# Patient Record
Sex: Female | Born: 1960 | Race: Black or African American | Hispanic: No | Marital: Married | Smoking: Never smoker
Health system: Southern US, Community
[De-identification: ages and names within clinical notes are randomized; demographics above are authoritative.]

## PROBLEM LIST (undated history)

## (undated) DIAGNOSIS — E7800 Pure hypercholesterolemia, unspecified: Secondary | ICD-10-CM

## (undated) DIAGNOSIS — I10 Essential (primary) hypertension: Secondary | ICD-10-CM

## (undated) DIAGNOSIS — E876 Hypokalemia: Secondary | ICD-10-CM

## (undated) DIAGNOSIS — Z309 Encounter for contraceptive management, unspecified: Secondary | ICD-10-CM

## (undated) DIAGNOSIS — Z780 Asymptomatic menopausal state: Secondary | ICD-10-CM

## (undated) DIAGNOSIS — N951 Menopausal and female climacteric states: Secondary | ICD-10-CM

## (undated) DIAGNOSIS — Z79890 Hormone replacement therapy (postmenopausal): Secondary | ICD-10-CM

## (undated) DIAGNOSIS — B009 Herpesviral infection, unspecified: Secondary | ICD-10-CM

## (undated) DIAGNOSIS — R87619 Unspecified abnormal cytological findings in specimens from cervix uteri: Secondary | ICD-10-CM

## (undated) HISTORY — DX: Unspecified abnormal cytological findings in specimens from cervix uteri: R87.619

## (undated) HISTORY — DX: Menopausal and female climacteric states: N95.1

## (undated) HISTORY — DX: Encounter for contraceptive management, unspecified: Z30.9

## (undated) HISTORY — DX: Hormone replacement therapy: Z79.890

## (undated) HISTORY — DX: Essential (primary) hypertension: I10

## (undated) HISTORY — DX: Pure hypercholesterolemia, unspecified: E78.00

## (undated) HISTORY — PX: SHOULDER ARTHROSCOPY: SHX128

## (undated) HISTORY — DX: Hypokalemia: E87.6

## (undated) HISTORY — DX: Asymptomatic menopausal state: Z78.0

## (undated) HISTORY — PX: CERVICAL CONE BIOPSY: SUR198

## (undated) HISTORY — DX: Herpesviral infection, unspecified: B00.9

---

## 2000-10-08 ENCOUNTER — Encounter: Payer: Self-pay | Admitting: Internal Medicine

## 2000-10-08 ENCOUNTER — Ambulatory Visit (HOSPITAL_COMMUNITY): Admission: RE | Admit: 2000-10-08 | Discharge: 2000-10-08 | Payer: Self-pay | Admitting: Internal Medicine

## 2000-11-22 ENCOUNTER — Ambulatory Visit (HOSPITAL_COMMUNITY): Admission: RE | Admit: 2000-11-22 | Discharge: 2000-11-22 | Payer: Self-pay | Admitting: Neurology

## 2001-01-31 ENCOUNTER — Ambulatory Visit (HOSPITAL_COMMUNITY): Admission: RE | Admit: 2001-01-31 | Discharge: 2001-01-31 | Payer: Self-pay | Admitting: Neurology

## 2001-01-31 ENCOUNTER — Encounter: Payer: Self-pay | Admitting: Neurology

## 2001-07-01 ENCOUNTER — Other Ambulatory Visit: Admission: RE | Admit: 2001-07-01 | Discharge: 2001-07-01 | Payer: Self-pay

## 2001-11-28 ENCOUNTER — Emergency Department (HOSPITAL_COMMUNITY): Admission: EM | Admit: 2001-11-28 | Discharge: 2001-11-28 | Payer: Self-pay | Admitting: Emergency Medicine

## 2002-05-05 ENCOUNTER — Emergency Department (HOSPITAL_COMMUNITY): Admission: EM | Admit: 2002-05-05 | Discharge: 2002-05-06 | Payer: Self-pay | Admitting: Emergency Medicine

## 2002-06-25 ENCOUNTER — Encounter: Payer: Self-pay | Admitting: Family Medicine

## 2002-06-25 ENCOUNTER — Ambulatory Visit (HOSPITAL_COMMUNITY): Admission: RE | Admit: 2002-06-25 | Discharge: 2002-06-25 | Payer: Self-pay | Admitting: Family Medicine

## 2002-07-21 ENCOUNTER — Other Ambulatory Visit: Admission: RE | Admit: 2002-07-21 | Discharge: 2002-07-21 | Payer: Self-pay

## 2002-12-26 ENCOUNTER — Emergency Department (HOSPITAL_COMMUNITY): Admission: EM | Admit: 2002-12-26 | Discharge: 2002-12-26 | Payer: Self-pay | Admitting: Emergency Medicine

## 2003-05-04 ENCOUNTER — Ambulatory Visit (HOSPITAL_COMMUNITY): Admission: RE | Admit: 2003-05-04 | Discharge: 2003-05-04 | Payer: Self-pay

## 2004-11-06 ENCOUNTER — Emergency Department (HOSPITAL_COMMUNITY): Admission: EM | Admit: 2004-11-06 | Discharge: 2004-11-06 | Payer: Self-pay | Admitting: Emergency Medicine

## 2004-11-11 ENCOUNTER — Emergency Department (HOSPITAL_COMMUNITY): Admission: EM | Admit: 2004-11-11 | Discharge: 2004-11-11 | Payer: Self-pay | Admitting: Emergency Medicine

## 2004-11-23 ENCOUNTER — Ambulatory Visit: Payer: Self-pay | Admitting: Orthopedic Surgery

## 2004-11-27 ENCOUNTER — Encounter (HOSPITAL_COMMUNITY): Admission: RE | Admit: 2004-11-27 | Discharge: 2004-12-27 | Payer: Self-pay | Admitting: Orthopedic Surgery

## 2004-12-28 ENCOUNTER — Encounter (HOSPITAL_COMMUNITY): Admission: RE | Admit: 2004-12-28 | Discharge: 2005-01-27 | Payer: Self-pay | Admitting: Orthopedic Surgery

## 2005-01-11 ENCOUNTER — Ambulatory Visit: Payer: Self-pay | Admitting: Orthopedic Surgery

## 2005-01-19 ENCOUNTER — Emergency Department (HOSPITAL_COMMUNITY): Admission: EM | Admit: 2005-01-19 | Discharge: 2005-01-20 | Payer: Self-pay | Admitting: *Deleted

## 2005-04-10 ENCOUNTER — Other Ambulatory Visit: Admission: RE | Admit: 2005-04-10 | Discharge: 2005-04-10 | Payer: Self-pay

## 2005-06-28 ENCOUNTER — Ambulatory Visit: Payer: Self-pay | Admitting: Orthopedic Surgery

## 2006-07-17 IMAGING — CR DG SHOULDER 2+V*R*
2 series · 2 of 2 positions shown · non-contrast
Comparison: none

CLINICAL DATA: Dislocation, pain. 
 RIGHT SHOULDER - 2 VIEW:
 There is an inferior dislocation of the shoulder.   Question Hill-Sachs deformity.   Otherwise normal fracture.

[view not recorded (1 of 2)]
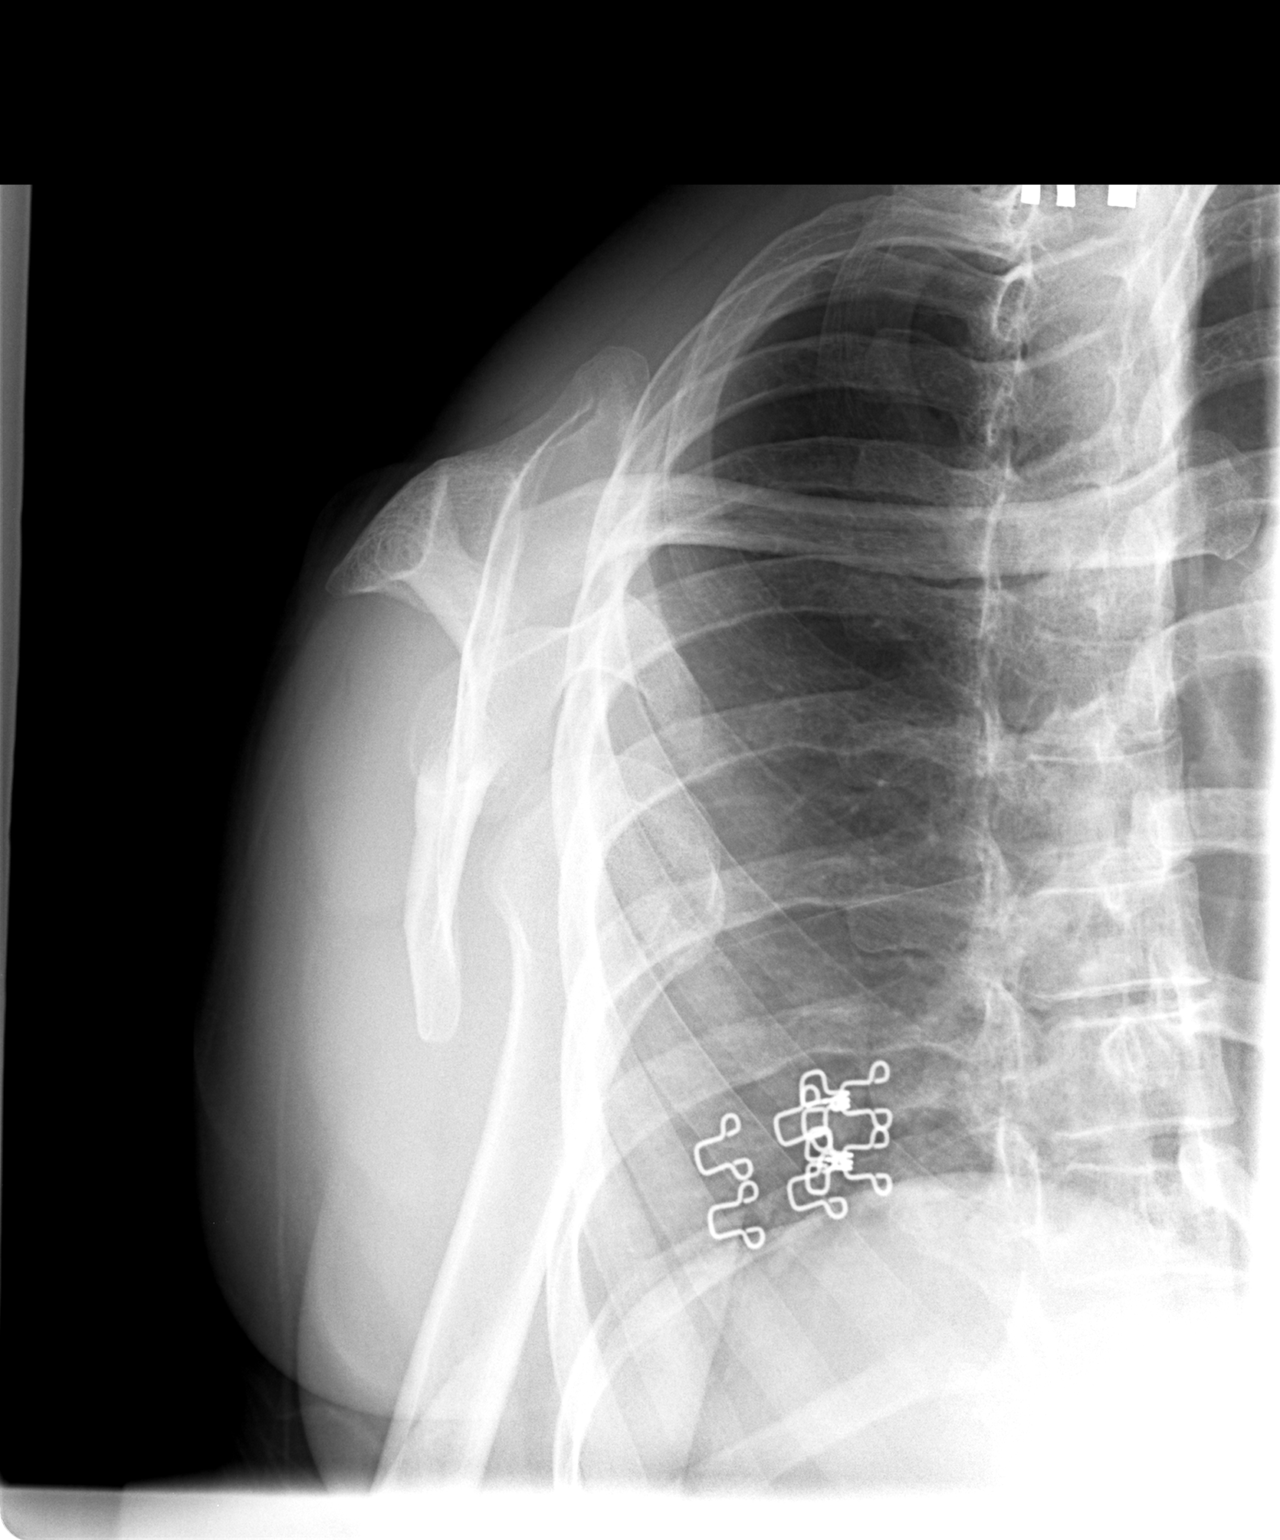

[view not recorded (2 of 2)]
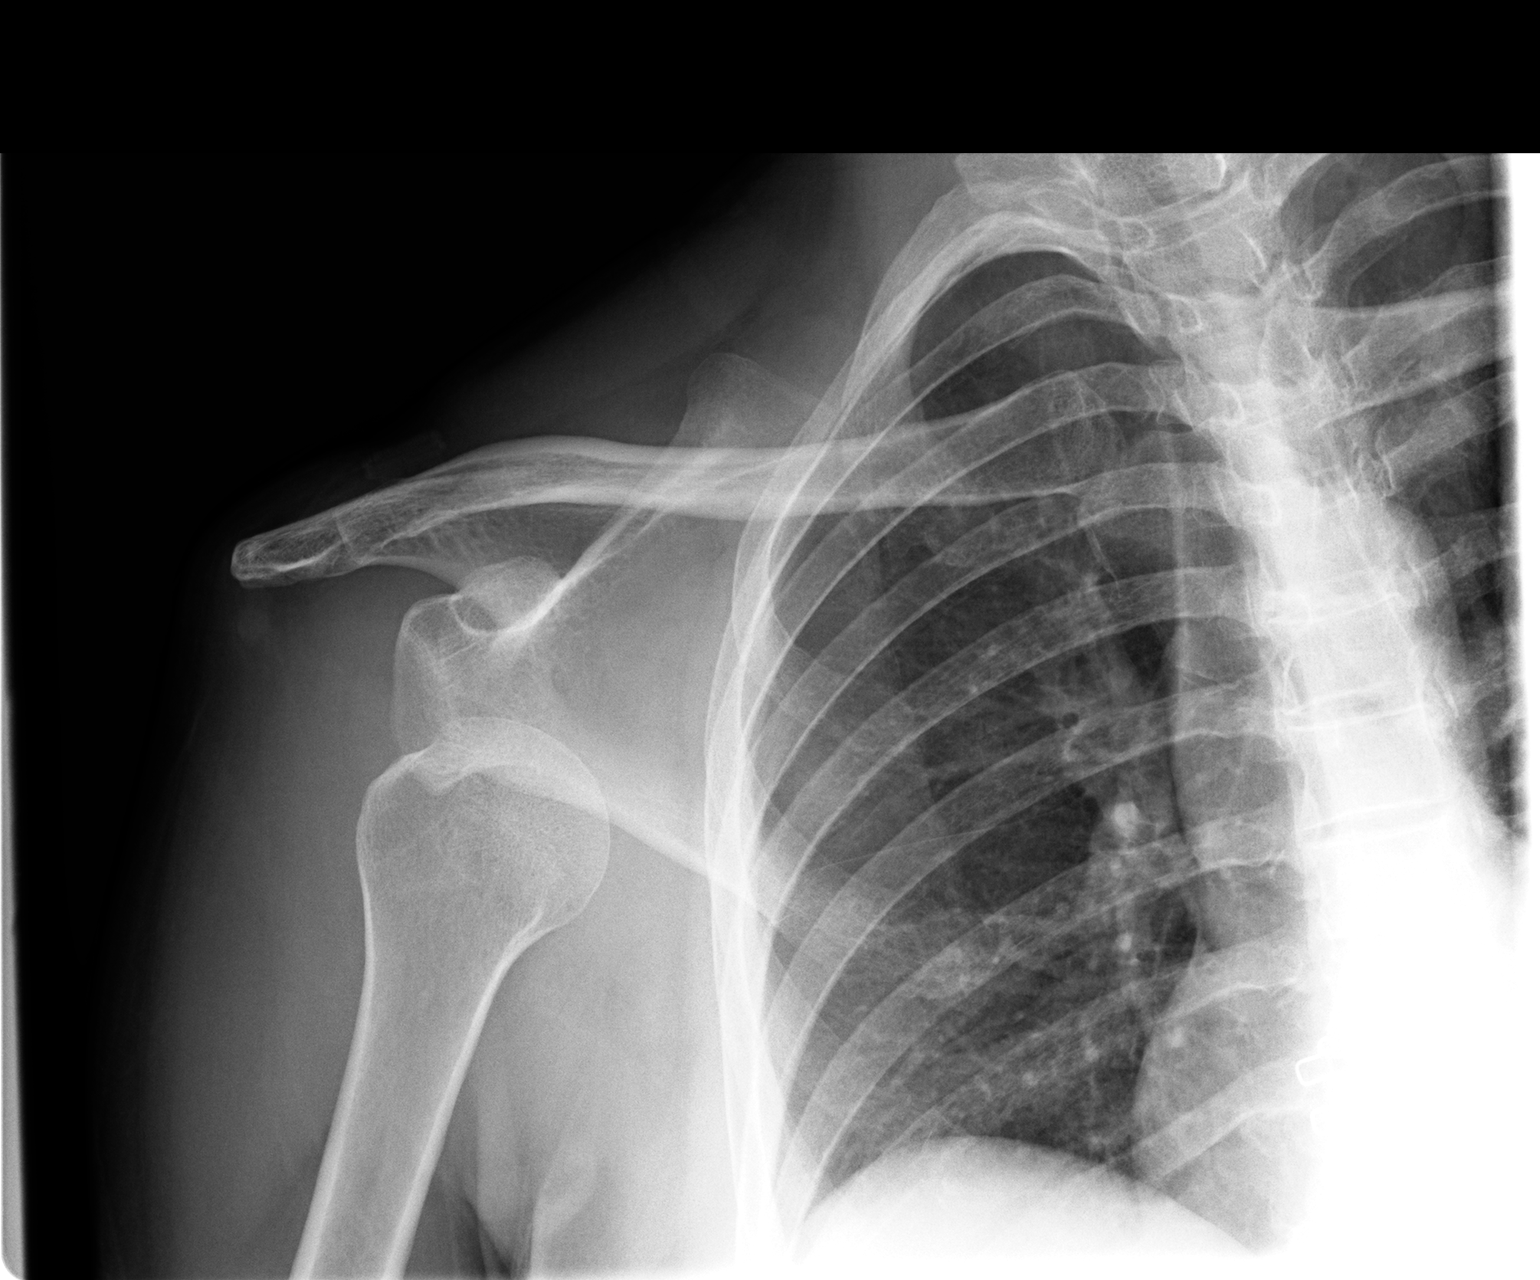

[2 of 2 positions shown; findings below may reference images not displayed]

IMPRESSION: Inferior dislocation of the shoulder.

## 2006-07-17 IMAGING — CR DG SHOULDER 2+V PORT*R*
1 series · 1 of 1 positions shown · non-contrast
Comparison: none

HISTORY: Right shoulder pain, post reduction

PORTABLE RIGHT SHOULDER ONE VIEW:
Portable exam 3310 hours.
Apparent reduction of glenohumeral dislocation on single AP view.
No orthogonal view for correlation.
No definite fracture visualized.
AC joint alignment normal.

[view not recorded]
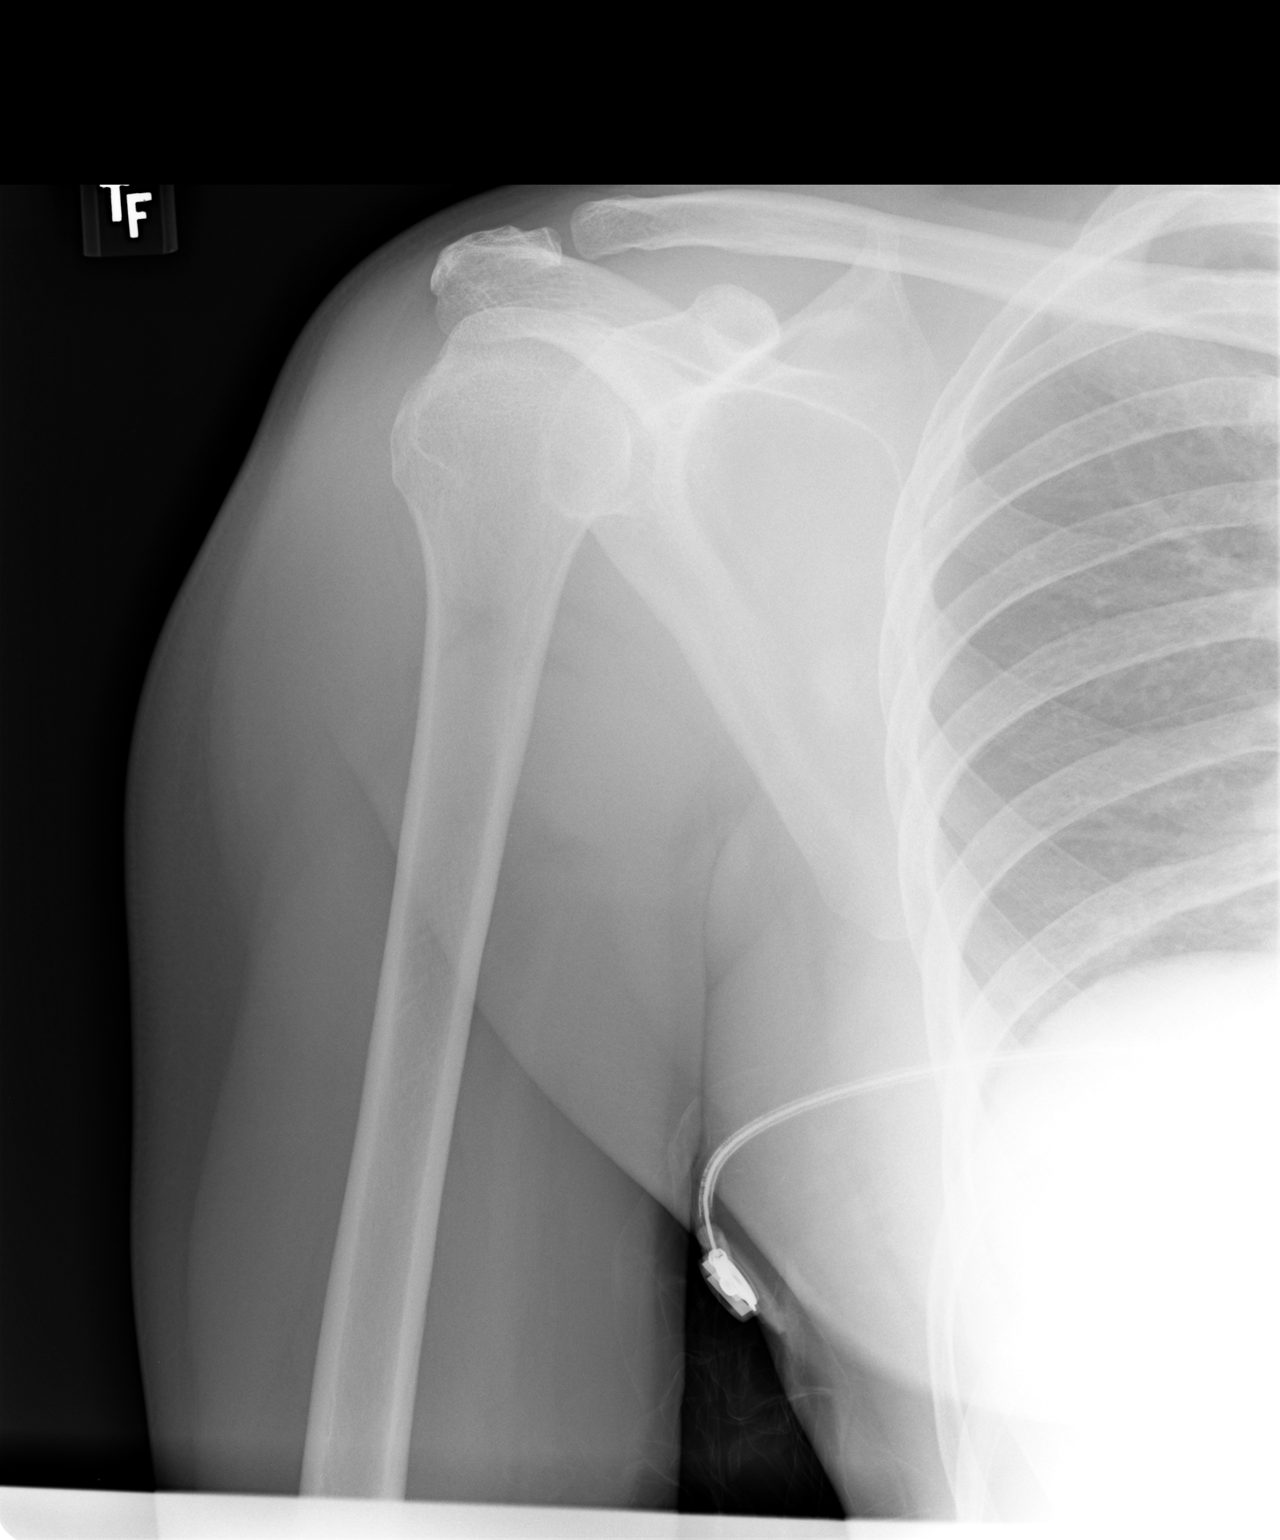

[1 of 1 positions shown; findings below may reference images not displayed]

IMPRESSION: Apparent reduction of glenohumeral dislocation on single AP view.

## 2007-06-13 ENCOUNTER — Ambulatory Visit (HOSPITAL_COMMUNITY): Admission: RE | Admit: 2007-06-13 | Discharge: 2007-06-13 | Payer: Self-pay | Admitting: Family Medicine

## 2010-04-19 ENCOUNTER — Other Ambulatory Visit
Admission: RE | Admit: 2010-04-19 | Discharge: 2010-04-19 | Payer: Self-pay | Source: Home / Self Care | Admitting: Obstetrics & Gynecology

## 2010-04-24 ENCOUNTER — Ambulatory Visit (HOSPITAL_COMMUNITY)
Admission: RE | Admit: 2010-04-24 | Discharge: 2010-04-24 | Payer: Self-pay | Source: Home / Self Care | Admitting: Obstetrics & Gynecology

## 2010-07-03 ENCOUNTER — Emergency Department (HOSPITAL_COMMUNITY)
Admission: EM | Admit: 2010-07-03 | Discharge: 2010-07-03 | Disposition: A | Discharge status: Home / Self Care | Payer: BC Managed Care – PPO | Attending: Emergency Medicine | Admitting: Emergency Medicine

## 2010-07-03 DIAGNOSIS — K6289 Other specified diseases of anus and rectum: Secondary | ICD-10-CM | POA: Insufficient documentation

## 2010-07-03 DIAGNOSIS — K5900 Constipation, unspecified: Secondary | ICD-10-CM | POA: Insufficient documentation

## 2010-07-03 DIAGNOSIS — K625 Hemorrhage of anus and rectum: Secondary | ICD-10-CM | POA: Insufficient documentation

## 2010-10-06 NOTE — Procedures (Signed)
[facility]  Patient:    Beth Vang, Beth Vang                    MRN: [id number] Proc. Date: 11/22/00 Attending:  Kelli Hope, M.D.                           Procedure Report  DATE OF BIRTH:  1961-03-14  PROCEDURE PERFORMED:  Diagnostic lumbar puncture.  OPERATOR:  Kelli Hope, M.D.  INDICATIONS FOR PROCEDURE:  Rule out multiple sclerosis.  DESCRIPTION OF PROCEDURE:  Informed consent form was signed and placed in the chart after indications, risks and benefits of the procedure were discussed with the patient and she agreed to proceed.  The patient was placed in the right lateral decubitus position and prepped and draped in the usual sterile fashion.  Local anesthesia was achieved with 5 cc of lidocaine.  A 20 gauge spinal needle was inserted into the L3-4 interspace and advanced until clear CSF was obtained.  Opening pressure was 210 mmH2O.  A total of 8 cc of spinal fluid was drained and sent off for the following studies:  tube #1 2 cc cell count and differential, tube #2 2 cc glucose, protein, oligoclonal banding, immunoelectrophoresis, tube #3 2 cc Gram stain and culture, tube #4 2 cc hold. Closing pressure was 180 mm water.  Needle was withdrawn and hemostasis was obtained.  The patient tolerated the procedure well.  She was advised to lie flat for one hour after which she may be discharged home.  She was to call if any complications such as severe headache, fever or bleeding occur. DD:  11/22/00 TD:  11/22/00 Job: [id number]

## 2011-05-08 ENCOUNTER — Other Ambulatory Visit: Payer: Self-pay | Admitting: Obstetrics & Gynecology

## 2011-05-10 ENCOUNTER — Other Ambulatory Visit: Payer: Self-pay | Admitting: Obstetrics & Gynecology

## 2011-05-10 ENCOUNTER — Other Ambulatory Visit (HOSPITAL_COMMUNITY)
Admission: RE | Admit: 2011-05-10 | Discharge: 2011-05-10 | Disposition: A | Discharge status: Home / Self Care | Payer: BC Managed Care – PPO | Source: Ambulatory Visit | Attending: Obstetrics & Gynecology | Admitting: Obstetrics & Gynecology

## 2011-05-10 DIAGNOSIS — Z01419 Encounter for gynecological examination (general) (routine) without abnormal findings: Secondary | ICD-10-CM | POA: Insufficient documentation

## 2011-05-14 ENCOUNTER — Other Ambulatory Visit: Payer: Self-pay | Admitting: Obstetrics & Gynecology

## 2012-05-28 ENCOUNTER — Other Ambulatory Visit: Payer: Self-pay | Admitting: Adult Health

## 2012-05-28 ENCOUNTER — Other Ambulatory Visit (HOSPITAL_COMMUNITY)
Admission: RE | Admit: 2012-05-28 | Discharge: 2012-05-28 | Disposition: A | Discharge status: Home / Self Care | Payer: BC Managed Care – PPO | Source: Ambulatory Visit | Attending: Obstetrics and Gynecology | Admitting: Obstetrics and Gynecology

## 2012-05-28 DIAGNOSIS — Z1151 Encounter for screening for human papillomavirus (HPV): Secondary | ICD-10-CM | POA: Insufficient documentation

## 2012-05-28 DIAGNOSIS — Z01419 Encounter for gynecological examination (general) (routine) without abnormal findings: Secondary | ICD-10-CM | POA: Insufficient documentation

## 2012-05-28 DIAGNOSIS — Z113 Encounter for screening for infections with a predominantly sexual mode of transmission: Secondary | ICD-10-CM | POA: Insufficient documentation

## 2013-05-07 ENCOUNTER — Other Ambulatory Visit: Payer: Self-pay | Admitting: Adult Health

## 2013-05-07 DIAGNOSIS — Z139 Encounter for screening, unspecified: Secondary | ICD-10-CM

## 2013-05-08 ENCOUNTER — Ambulatory Visit (HOSPITAL_COMMUNITY)
Admission: RE | Admit: 2013-05-08 | Discharge: 2013-05-08 | Disposition: A | Discharge status: Home / Self Care | Payer: BC Managed Care – PPO | Source: Ambulatory Visit | Attending: Adult Health | Admitting: Adult Health

## 2013-05-08 DIAGNOSIS — Z139 Encounter for screening, unspecified: Secondary | ICD-10-CM

## 2013-05-08 DIAGNOSIS — Z1231 Encounter for screening mammogram for malignant neoplasm of breast: Secondary | ICD-10-CM | POA: Insufficient documentation

## 2013-06-05 ENCOUNTER — Other Ambulatory Visit: Payer: Self-pay | Admitting: Adult Health

## 2013-06-25 ENCOUNTER — Encounter: Payer: Self-pay | Admitting: Adult Health

## 2013-06-25 ENCOUNTER — Ambulatory Visit (INDEPENDENT_AMBULATORY_CARE_PROVIDER_SITE_OTHER): Payer: BC Managed Care – PPO | Admitting: Adult Health

## 2013-06-25 VITALS — BP 128/78 | HR 76 | Ht 64.0 in | Wt 130.0 lb

## 2013-06-25 DIAGNOSIS — E7800 Pure hypercholesterolemia, unspecified: Secondary | ICD-10-CM | POA: Insufficient documentation

## 2013-06-25 DIAGNOSIS — Z01419 Encounter for gynecological examination (general) (routine) without abnormal findings: Secondary | ICD-10-CM

## 2013-06-25 DIAGNOSIS — Z309 Encounter for contraceptive management, unspecified: Secondary | ICD-10-CM

## 2013-06-25 DIAGNOSIS — Z1212 Encounter for screening for malignant neoplasm of rectum: Secondary | ICD-10-CM

## 2013-06-25 DIAGNOSIS — I10 Essential (primary) hypertension: Secondary | ICD-10-CM

## 2013-06-25 HISTORY — DX: Encounter for contraceptive management, unspecified: Z30.9

## 2013-06-25 LAB — HEMOCCULT GUIAC POC 1CARD (OFFICE): FECAL OCCULT BLD: NEGATIVE

## 2013-06-25 MED ORDER — LISINOPRIL 10 MG PO TABS: 10.0000 mg | ORAL_TABLET | Freq: Every day | ORAL | Status: DC

## 2013-06-25 MED ORDER — PRAVASTATIN SODIUM 20 MG PO TABS: 20.0000 mg | ORAL_TABLET | Freq: Every day | ORAL | Status: DC

## 2013-06-25 MED ORDER — DESOGEST-ETH ESTRAD TRIPHASIC 0.1/0.125/0.15 -0.025 MG PO TABS: 1.0000 | ORAL_TABLET | Freq: Every day | ORAL | Status: DC

## 2013-06-25 NOTE — Patient Instructions (Signed)
Hypertension As your heart beats, it forces blood through your arteries. This force is your blood pressure. If the pressure is too high, it is called hypertension (HTN) or high blood pressure. HTN is dangerous because you may have it and not know it. High blood pressure may mean that your heart has to work harder to pump blood. Your arteries may be narrow or stiff. The extra work puts you at risk for heart disease, stroke, and other problems.  Blood pressure consists of two numbers, a higher number over a lower, 110/72, for example. It is stated as "110 over 72." The ideal is below 120 for the top number (systolic) and under 80 for the bottom (diastolic). Write down your blood pressure today. You should pay close attention to your blood pressure if you have certain conditions such as:  Heart failure.  Prior heart attack.  Diabetes  Chronic kidney disease.  Prior stroke.  Multiple risk factors for heart disease. To see if you have HTN, your blood pressure should be measured while you are seated with your arm held at the level of the heart. It should be measured at least twice. A one-time elevated blood pressure reading (especially in the Emergency Department) does not mean that you need treatment. There may be conditions in which the blood pressure is different between your right and left arms. It is important to see your caregiver soon for a recheck. Most people have essential hypertension which means that there is not a specific cause. This type of high blood pressure may be lowered by changing lifestyle factors such as:  Stress.  Smoking.  Lack of exercise.  Excessive weight.  Drug/tobacco/alcohol use.  Eating less salt. Most people do not have symptoms from high blood pressure until it has caused damage to the body. Effective treatment can often prevent, delay or reduce that damage. TREATMENT  When a cause has been identified, treatment for high blood pressure is directed at the  cause. There are a large number of medications to treat HTN. These fall into several categories, and your caregiver will help you select the medicines that are best for you. Medications may have side effects. You should review side effects with your caregiver. If your blood pressure stays high after you have made lifestyle changes or started on medicines,   Your medication(s) may need to be changed.  Other problems may need to be addressed.  Be certain you understand your prescriptions, and know how and when to take your medicine.  Be sure to follow up with your caregiver within the time frame advised (usually within two weeks) to have your blood pressure rechecked and to review your medications.  If you are taking more than one medicine to lower your blood pressure, make sure you know how and at what times they should be taken. Taking two medicines at the same time can result in blood pressure that is too low. SEEK IMMEDIATE MEDICAL CARE IF:  You develop a severe headache, blurred or changing vision, or confusion.  You have unusual weakness or numbness, or a faint feeling.  You have severe chest or abdominal pain, vomiting, or breathing problems. MAKE SURE YOU:   Understand these instructions.  Will watch your condition.  Will get help right away if you are not doing well or get worse. Document Released: 05/07/2005 Document Revised: 07/30/2011 Document Reviewed: 12/26/2007 [facility] Patient Information 2014 [place], [place]. Hypercholesterolemia High Blood Cholesterol Cholesterol is a white, waxy, fat-like protein needed by your body in  small amounts. The liver makes all the cholesterol you need. It is carried from the liver by the blood through the blood vessels. Deposits (plaque) may build up on blood vessel walls. This makes the arteries narrower and stiffer. Plaque increases the risk for heart attack and stroke. You cannot feel your cholesterol level even if it is very high. The  only way to know is by a blood test to check your lipid (fats) levels. Once you know your cholesterol levels, you should keep a record of the test results. Work with your caregiver to to keep your levels in the desired range. WHAT THE RESULTS MEAN:  Total cholesterol is a rough measure of all the cholesterol in your blood.  LDL is the so-called bad cholesterol. This is the type that deposits cholesterol in the walls of the arteries. You want this level to be low.  HDL is the good cholesterol because it cleans the arteries and carries the LDL away. You want this level to be high.  Triglycerides are fat that the body can either burn for energy or store. High levels are closely linked to heart disease. DESIRED LEVELS:  Total cholesterol below 200.  LDL below 100 for people at risk, below 70 for very high risk.  HDL above 50 is good, above 60 is best.  Triglycerides below 150. HOW TO LOWER YOUR CHOLESTEROL:  Diet.  Choose fish or white meat chicken and [place]key, roasted or baked. Limit fatty cuts of red meat, fried foods, and processed meats, such as sausage and lunch meat.  Eat lots of fresh fruits and vegetables. Choose whole grains, beans, pasta, potatoes and cereals.  Use only small amounts of olive, corn or canola oils. Avoid butter, mayonnaise, shortening or palm kernel oils. Avoid foods with trans-fats.  Use skim/nonfat milk and low-fat/nonfat yogurt and cheeses. Avoid whole milk, cream, ice cream, egg yolks and cheeses. Healthy desserts include angel food cake, gingersnaps, animal crackers, hard candy, popsicles, and low-fat/nonfat frozen yogurt. Avoid pastries, cakes, pies and cookies.  Exercise.  A regular program helps decrease LDL and raises HDL.  Helps with weight control.  Do things that increase your activity level like gardening, walking, or taking the stairs.  Medication.  May be prescribed by your caregiver to help lowering cholesterol and the risk for heart  disease.  You may need medicine even if your levels are normal if you have several risk factors. HOME CARE INSTRUCTIONS   Follow your diet and exercise programs as suggested by your caregiver.  Take medications as directed.  Have blood work done when your caregiver feels it is necessary. MAKE SURE YOU:   Understand these instructions.  Will watch your condition.  Will get help right away if you are not doing well or get worse. Document Released: 05/07/2005 Document Revised: 07/30/2011 Document Reviewed: 10/23/2006 [facility] Patient Information 2014 [place]Care, [place]. Follow up in 2 months for fasting labs and see me

## 2013-06-25 NOTE — Progress Notes (Signed)
Patient ID: Beth BolkWillette M Vang, female   DOB: 07/18/1960, 53 y.o.   MRN: [id number] History of Present Illness: Beth Vang is a 53 year old black female, in for a physical, she had a normal pap with negative HPV 05/28/12.  Current Medications, Allergies, Past Medical History, Past Surgical History, Family History and Social History were [facility].     Review of Systems: Patient denies any headaches, blurred vision, shortness of breath, chest pain, abdominal pain, problems with bowel movements, urination, or intercourse. No mood swings, has some pain in left knee was hurt at work about 2 weeks. Periods light, still on velivet.   Physical Exam:BP 128/78  Pulse 76  Ht 5\' 4"  (1.626 m)  Wt 130 lb (58.968 kg)  BMI 22.30 kg/m2  LMP 05/28/2013 General:  Well developed, well nourished, no acute distress Skin:  Warm and dry Neck:  Midline trachea, normal thyroid Lungs; Clear to auscultation bilaterally Breast:  No dominant palpable mass, retraction, or nipple discharge Cardiovascular: Regular rate and rhythm Abdomen:  Soft, non tender, no hepatosplenomegaly Pelvic:  External genitalia is normal in appearance.  The vagina is normal in appearance. The cervix is bulbous.  Uterus is felt to be normal size, shape, and contour.  No adnexal masses or tenderness noted. Rectal: Good sphincter tone, no polyps, or hemorrhoids felt.  Hemoccult negative. Extremities: No varicosities noted, has fading bruises right and left knees Psych:  No mood changes, alert and cooperative, seems happy Reviewed labs with pt she had done at work  And will put back on cholesterol meds and BP meds, which she has been off of a while now.TC 250,LDL173.TSH 5.62  Impression: Yearly gyn exam no pap Elevated cholesterol Hypertension Contraceptive mangement    Plan: Physical in 1 year Mammogram yearly Rx pravastatin 20 mg #30 1 at hs with 11 refills Rx lisinipril 10 mg #30 1 daily  with 11 refills Rx velivet with 9 refills take 1 daily Will recheck labs in 8 weeks CMP, TSH and lipids Will stop OCs at 53 and be of 1 month then [facility]

## 2013-08-11 ENCOUNTER — Telehealth: Payer: Self-pay | Admitting: Adult Health

## 2013-08-11 MED ORDER — VALACYCLOVIR HCL 1 G PO TABS
ORAL_TABLET | ORAL | Status: DC
Start: 2013-08-11 — End: 2014-08-23

## 2013-08-11 NOTE — Telephone Encounter (Signed)
Needs refill on valtrex  

## 2013-08-11 NOTE — Addendum Note (Signed)
Addended by: Cyril MourningGRIFFIN, Malayah Demuro A on: 08/11/2013 02:01 PM   Modules accepted: Orders

## 2013-08-11 NOTE — Telephone Encounter (Signed)
Not available

## 2013-08-24 ENCOUNTER — Encounter: Payer: Self-pay | Admitting: Adult Health

## 2013-08-24 ENCOUNTER — Encounter (INDEPENDENT_AMBULATORY_CARE_PROVIDER_SITE_OTHER): Payer: Self-pay

## 2013-08-24 ENCOUNTER — Ambulatory Visit (INDEPENDENT_AMBULATORY_CARE_PROVIDER_SITE_OTHER): Payer: BC Managed Care – PPO | Admitting: Adult Health

## 2013-08-24 ENCOUNTER — Other Ambulatory Visit: Payer: BC Managed Care – PPO

## 2013-08-24 VITALS — BP 120/80 | Ht 64.0 in | Wt 136.0 lb

## 2013-08-24 DIAGNOSIS — I10 Essential (primary) hypertension: Secondary | ICD-10-CM

## 2013-08-24 DIAGNOSIS — Z1329 Encounter for screening for other suspected endocrine disorder: Secondary | ICD-10-CM

## 2013-08-24 DIAGNOSIS — E7800 Pure hypercholesterolemia, unspecified: Secondary | ICD-10-CM

## 2013-08-24 DIAGNOSIS — Z0000 Encounter for general adult medical examination without abnormal findings: Secondary | ICD-10-CM

## 2013-08-24 LAB — LIPID PANEL
CHOL/HDL RATIO: 3.2 ratio
Cholesterol: 182 mg/dL (ref 0–200)
HDL: 57 mg/dL (ref >39)
LDL CALC: 115 mg/dL — AB (ref 0–99)
TRIGLYCERIDES: 52 mg/dL (ref <150)
VLDL: 10 mg/dL (ref 0–40)

## 2013-08-24 LAB — COMPREHENSIVE METABOLIC PANEL
ALBUMIN: 3.8 g/dL (ref 3.5–5.2)
ALK PHOS: 32 U/L — AB (ref 39–117)
ALT: 9 U/L (ref 0–35)
AST: 15 U/L (ref 0–37)
BUN: 18 mg/dL (ref 6–23)
CHLORIDE: 104 meq/L (ref 96–112)
CO2: 27 mEq/L (ref 19–32)
CREATININE: 0.92 mg/dL (ref 0.50–1.10)
Calcium: 9.1 mg/dL (ref 8.4–10.5)
Glucose, Bld: 80 mg/dL (ref 70–99)
Potassium: 4.4 mEq/L (ref 3.5–5.3)
Sodium: 138 mEq/L (ref 135–145)
Total Bilirubin: 0.3 mg/dL (ref 0.2–1.2)
Total Protein: 6.1 g/dL (ref 6.0–8.3)

## 2013-08-24 LAB — TSH: TSH: 4.05 u[IU]/mL (ref 0.350–4.500)

## 2013-08-24 NOTE — Progress Notes (Signed)
Subjective:     Patient ID: Beth Vang, female   DOB: 04/17/1961, 53 y.o.   MRN: [id number]  HPI Georgian CoWillette is a 53 year old black female in to get fasting labs and and check BP.  Review of Systems See HPI Reviewed past medical,surgical, social and family history. Reviewed medications and allergies.     Objective:   Physical Exam BP 120/80  Ht 5\' 4"  (1.626 m)  Wt 136 lb (61.689 kg)  BMI 23.33 kg/m2  LMP 08/20/2013   BP good even after working nights, taking meds as directed, no complaints, will get labs   Assessment:     Hypertension Elevated cholesterol     Plan:    Check TSH, CMP and lipids Will talk when labs back Stop OCs at 53 be off 1 month then [facility]   Continue meds

## 2013-08-24 NOTE — Patient Instructions (Signed)
Stop birth control pill at 53 be off 1 month then [facility] Call for labs

## 2013-08-26 ENCOUNTER — Telehealth: Payer: Self-pay | Admitting: Adult Health

## 2013-08-26 NOTE — Telephone Encounter (Signed)
Pt aware of labs  

## 2014-03-22 ENCOUNTER — Encounter: Payer: Self-pay | Admitting: Adult Health

## 2014-04-28 ENCOUNTER — Ambulatory Visit (INDEPENDENT_AMBULATORY_CARE_PROVIDER_SITE_OTHER): Payer: BC Managed Care – PPO | Admitting: Adult Health

## 2014-04-28 ENCOUNTER — Encounter: Payer: Self-pay | Admitting: Adult Health

## 2014-04-28 VITALS — BP 130/82 | Ht 64.0 in | Wt 130.5 lb

## 2014-04-28 DIAGNOSIS — N951 Menopausal and female climacteric states: Secondary | ICD-10-CM | POA: Insufficient documentation

## 2014-04-28 DIAGNOSIS — R946 Abnormal results of thyroid function studies: Secondary | ICD-10-CM

## 2014-04-28 DIAGNOSIS — R7989 Other specified abnormal findings of blood chemistry: Secondary | ICD-10-CM

## 2014-04-28 HISTORY — DX: Menopausal and female climacteric states: N95.1

## 2014-04-28 LAB — COMPREHENSIVE METABOLIC PANEL
ALBUMIN: 4.2 g/dL (ref 3.5–5.2)
ALT: 23 U/L (ref 0–35)
AST: 25 U/L (ref 0–37)
Alkaline Phosphatase: 50 U/L (ref 39–117)
BUN: 15 mg/dL (ref 6–23)
CHLORIDE: 105 meq/L (ref 96–112)
CO2: 28 meq/L (ref 19–32)
CREATININE: 0.87 mg/dL (ref 0.50–1.10)
Calcium: 9.8 mg/dL (ref 8.4–10.5)
GLUCOSE: 88 mg/dL (ref 70–99)
POTASSIUM: 4.4 meq/L (ref 3.5–5.3)
SODIUM: 139 meq/L (ref 135–145)
Total Bilirubin: 0.6 mg/dL (ref 0.2–1.2)
Total Protein: 6.8 g/dL (ref 6.0–8.3)

## 2014-04-28 LAB — TSH: TSH: 1.976 u[IU]/mL (ref 0.350–4.500)

## 2014-04-28 NOTE — Patient Instructions (Signed)
Will talk when labs back Use condoms

## 2014-04-28 NOTE — Progress Notes (Signed)
Subjective:     Patient ID: Beth BodoWillette Vang, female   DOB: 07/04/1960, 53 y.o.   MRN: [id number]  HPI Beth Vang is a 53 year old black female in for labs.  Review of Systems See HPI Reviewed past medical,surgical, social and family history. Reviewed medications and allergies.     Objective:   Physical Exam BP 130/82 mmHg  Ht 5\' 4"  (1.626 m)  Wt 130 lb 8 oz (59.194 kg)  BMI 22.39 kg/m2  LMP 04/02/2014   She stopped OCs a month ago and is in to [facility], had period in November and has noticed some headaches recently, she had labs at work and TSH was 8.08 in November, may feel better off OCs.  Assessment:     Peri menopause Elevated TSH    Plan:    Use condoms Check TSH, FSH and CMP Will talk when labs back

## 2014-04-29 LAB — FOLLICLE STIMULATING HORMONE: FSH: 62.3 m[IU]/mL

## 2014-05-04 ENCOUNTER — Telehealth: Payer: Self-pay | Admitting: Adult Health

## 2014-05-04 NOTE — Telephone Encounter (Signed)
Pt aware of labs and that TSH normal at 1.976 and that [facility] elevated at 62.3 which means she is post menopausal.keep record of periods and use condoms for awhile

## 2014-05-10 ENCOUNTER — Encounter: Payer: Self-pay | Admitting: Adult Health

## 2014-08-10 ENCOUNTER — Encounter: Payer: Self-pay | Admitting: Adult Health

## 2014-08-10 ENCOUNTER — Ambulatory Visit (INDEPENDENT_AMBULATORY_CARE_PROVIDER_SITE_OTHER): Payer: BLUE CROSS/BLUE SHIELD | Admitting: Adult Health

## 2014-08-10 VITALS — BP 130/80 | HR 80 | Ht 63.25 in | Wt 128.0 lb

## 2014-08-10 DIAGNOSIS — Z1212 Encounter for screening for malignant neoplasm of rectum: Secondary | ICD-10-CM

## 2014-08-10 DIAGNOSIS — I10 Essential (primary) hypertension: Secondary | ICD-10-CM

## 2014-08-10 DIAGNOSIS — Z01419 Encounter for gynecological examination (general) (routine) without abnormal findings: Secondary | ICD-10-CM

## 2014-08-10 DIAGNOSIS — E7800 Pure hypercholesterolemia, unspecified: Secondary | ICD-10-CM

## 2014-08-10 DIAGNOSIS — N951 Menopausal and female climacteric states: Secondary | ICD-10-CM

## 2014-08-10 DIAGNOSIS — Z780 Asymptomatic menopausal state: Secondary | ICD-10-CM

## 2014-08-10 DIAGNOSIS — Z79890 Hormone replacement therapy (postmenopausal): Secondary | ICD-10-CM

## 2014-08-10 HISTORY — DX: Hormone replacement therapy: Z79.890

## 2014-08-10 HISTORY — DX: Menopausal and female climacteric states: N95.1

## 2014-08-10 HISTORY — DX: Asymptomatic menopausal state: Z78.0

## 2014-08-10 LAB — HEMOCCULT GUIAC POC 1CARD (OFFICE): FECAL OCCULT BLD: NEGATIVE

## 2014-08-10 MED ORDER — CONJ ESTROGENS-BAZEDOXIFENE 0.45-20 MG PO TABS: ORAL_TABLET | ORAL | Status: DC

## 2014-08-10 NOTE — Patient Instructions (Signed)
Conjugated Estrogens; Bazedoxifene tablets What is this medicine? CONJUGATED ESTROGENS; BAZEDOXIFENE (CON ju gate ed ESS troe jenz; BAY ze DOX i feen) is used as hormone replacement in menopausal women who still have their uterus. This medicine helps to treat hot flashes and prevent osteoporosis (weak bones). This medicine may be used for other purposes; ask your health care provider or pharmacist if you have questions. COMMON BRAND NAME(S): DUAVEE What should I tell my health care provider before I take this medicine? They need to know if you have any of these conditions: -abnormal vaginal bleeding -blood vessel disease or blood clots -breast, cervical, endometrial, ovarian, liver, or uterine cancer -dementia -diabetes -endometriosis -fibroids -gallbladder disease -heart disease or recent heart attack -hereditary angioedema -high blood pressure -high cholesterol -high level of calcium in the blood -kidney disease -liver disease -mental depression -migraine headaches -porphyria -protein C deficiency -protein S deficiency -seizure disorder -stroke -systemic lupus erythematosus -thyroid disorder -tobacco smoker -an unusual or allergic reaction to estrogens, bazedoxifene, other medicines, foods, dyes, or preservatives -pregnant or trying to get pregnant -breast-feeding How should I use this medicine? Take this medicine by mouth with a glass of water. Follow the directions on the prescription label. Take your medicine at regular intervals, at the same time each day. Do not take your medicine more often than directed. A patient package insert for the product will be given with each prescription and refill. Read this sheet carefully each time. Talk to your pediatrician regarding the use of this medicine in children. This medicine is not approved for use in children. Overdosage: If you think you've taken too much of this medicine contact a poison control center or emergency room at  once. Overdosage: If you think you have taken too much of this medicine contact a poison control center or emergency room at once. NOTE: This medicine is only for you. Do not share this medicine with others. What if I miss a dose? If you miss a dose, take it as soon as you can. If it is almost time for your next dose, take only that dose. Do not take double or extra doses. What may interact with this medicine? -aromatase inhibitors like aminoglutethimide, anastrozole, exemestane, letrozole, testolactone -metyrapone This medicine may also interact with the following medications: -barbiturates, such as phenobarbital -carbamazepine -clarithromycin -erythromycin -grapefruit juice -medicines for fungal infections like ketoconazole and itraconazole -phenytoin -rifampin -ritonavir -St. John's Wort -thyroid hormones This list may not describe all possible interactions. Give your health care provider a list of all the medicines, herbs, non-prescription drugs, or dietary supplements you use. Also tell them if you smoke, drink alcohol, or use illegal drugs. Some items may interact with your medicine. What should I watch for while using this medicine? Do not take a progestin product or additional estrogen or estrogen-like products while taking this drug. Discuss the risks and benefits of taking this drug with your prescriber. Visit your health care professional for regular checks on your progress. You will need a regular breast and pelvic exam and Pap smear while on this medicine. You should also discuss the need for regular mammograms with your health care professional, and follow his or her guidelines for these tests. This medicine can make your body retain fluid, making your fingers, hands, or ankles swell. Your blood pressure can go up. Contact your doctor or health care professional if you feel you are retaining fluid. You should make sure you get enough calcium and vitamin D in your diet while you  are taking this medicine. Discuss your dietary needs with your health care professional or nutritionist. Exercise may help to prevent bone loss. Discuss your exercise needs with your doctor or health care professional. This medicine can rarely cause blood clots. You should avoid long periods of bed rest while taking this medicine. If you are going to have surgery, tell your doctor or health care professional that you are taking this medicine. This medicine should be stopped at least 3 days before surgery. After surgery, it should be restarted only after you are walking again. It should not be restarted while you still need long periods of bed rest. Smoking increases the risk of getting a blood clot or having a stroke while you are taking this medicine, especially if you are more than 53 years old. You are strongly advised not to smoke. If you have any reason to think you are pregnant; stop taking this medicine at once and contact your doctor or health care professional. If you wear contact lenses and notice visual changes, or if the lenses begin to feel uncomfortable, consult your eye care specialist. What side effects may I notice from receiving this medicine? Side effects that you should report to your doctor or health care professional as soon as possible: -allergic reactions like skin rash, itching or hives, swelling of the face, lips, or tongue -breast tissue changes or discharge -changes in vision -chest pain -confusion, trouble speaking or understanding -dark urine -general ill feeling or flu-like symptoms -light-colored stools -nausea, vomiting -pain, swelling, warmth in the leg -right upper belly pain -severe headaches -shortness of breath -sudden numbness or weakness of the face, arm or leg -trouble walking, dizziness, loss of balance or coordination -unusual vaginal bleeding -yellowing of the eyes or skin Side effects that usually do not require medical attention (Report these to  your doctor or health care professional if they continue or are bothersome.): -abdominal pain -diarrhea -dizziness -hair loss -increased hunger or thirst -increased urination -nausea -symptoms of vaginal infection like itching, irritation or unusual discharge -unusually weak or tired -upset stomach This list may not describe all possible side effects. Call your doctor for medical advice about side effects. You may report side effects to FDA at 1-800-FDA-1088. Where should I keep my medicine? Keep out of the reach of children. Store at room temperature between 15 and 30 degrees C (59 and 86 degrees F). Throw away any unused medicine after the expiration date. NOTE: This sheet is a summary. It may not cover all possible information. If you have questions about this medicine, talk to your doctor, pharmacist, or health care provider.  2015, Elsevier/Gold Standard. (2012-02-25 15:15:52) Return in 4 weeks for ROS and fasting labs Physical in 1 year Get mammogram yearly Colonoscopy advised

## 2014-08-10 NOTE — Progress Notes (Signed)
Patient ID: Beth Vang, female   DOB: 01/19/1961, 54 y.o.   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   Current Medications, Allergies, Past Medical History, Past Surgical History, Family History and Social History were reviewed in Beth Vang Link electronic medical record.     Review of Systems: Patient denies any daily headaches, hearing loss, fatigue, blurred vision, shortness of breath, chest pain, abdominal pain, problems with bowel movements, urination, or intercourse. No joint pain or mood swings.See HPI for positives.    Physical Exam:BP 130/80 mmHg  Pulse 80  Ht 5' 3.25" (1.607 m)  Wt 128 lb (58.06 kg)  BMI 22.48 kg/m2  LMP 04/07/2014 (Approximate) General:  Well developed, well nourished, no acute distress Skin:  Warm and dry Neck:  Midline trachea, normal thyroid, good ROM, no lymphadenopathy Lungs; Clear to auscultation bilaterally Breast:  No dominant palpable mass, retraction, or nipple discharge Cardiovascular: Regular rate and rhythm Abdomen:  Soft, non tender, no hepatosplenomegaly Pelvic:  External genitalia is normal in appearance, no lesions.  The vagina is normal in appearance, has good color, moisture and rugae. Urethra has no lesions or masses. The cervix is bulbous.  Uterus is felt to be normal size, shape, and contour.  No adnexal masses or tenderness noted.Bladder is non tender, no masses felt. Rectal: Good sphincter tone, no polyps, or hemorrhoids felt.  Hemoccult negative. Extremities/musculoskeletal:  No swelling or varicosities noted, no clubbing or cyanosis Psych:  No mood changes, alert and cooperative,seems happy Discussed trying HRT to see if helps with hot flashes and sleep,she is aware  of risk and benefits and wants to try.  Impression: Well woman gyn exam no pap Postmenopause Hot flashes Hypertension Elevated cholesterol HRT    Plan: Rx duavee #28 given 1 daily lot [id number] exp 6/17 Return in 4 week for follow up and fasting labs lipids and CMP Pap and physical in 1 year Mammogram yearly Colonoscopy advised  Review handout on duavee Continue lisinopril and pravastatin, has refills

## 2014-08-23 ENCOUNTER — Other Ambulatory Visit: Payer: Self-pay | Admitting: Adult Health

## 2014-09-08 ENCOUNTER — Ambulatory Visit (INDEPENDENT_AMBULATORY_CARE_PROVIDER_SITE_OTHER): Payer: BLUE CROSS/BLUE SHIELD | Admitting: Adult Health

## 2014-09-08 ENCOUNTER — Other Ambulatory Visit: Payer: BLUE CROSS/BLUE SHIELD

## 2014-09-08 ENCOUNTER — Encounter: Payer: Self-pay | Admitting: Adult Health

## 2014-09-08 VITALS — BP 116/62 | HR 76 | Ht 64.0 in | Wt 127.5 lb

## 2014-09-08 DIAGNOSIS — E7800 Pure hypercholesterolemia, unspecified: Secondary | ICD-10-CM

## 2014-09-08 DIAGNOSIS — Z780 Asymptomatic menopausal state: Secondary | ICD-10-CM | POA: Diagnosis not present

## 2014-09-08 DIAGNOSIS — Z79890 Hormone replacement therapy (postmenopausal): Secondary | ICD-10-CM | POA: Diagnosis not present

## 2014-09-08 DIAGNOSIS — N951 Menopausal and female climacteric states: Secondary | ICD-10-CM

## 2014-09-08 DIAGNOSIS — Z13220 Encounter for screening for lipoid disorders: Secondary | ICD-10-CM

## 2014-09-08 DIAGNOSIS — Z1329 Encounter for screening for other suspected endocrine disorder: Secondary | ICD-10-CM

## 2014-09-08 DIAGNOSIS — Z01419 Encounter for gynecological examination (general) (routine) without abnormal findings: Secondary | ICD-10-CM

## 2014-09-08 MED ORDER — CONJ ESTROGENS-BAZEDOXIFENE 0.45-20 MG PO TABS: ORAL_TABLET | ORAL | Status: DC

## 2014-09-08 NOTE — Progress Notes (Signed)
Subjective:     Patient ID: Beth Vang, female   DOB: 01/24/1961, 54 y.o.   MRN: [id number]  HPI Beth Vang is a 54 year old black female back in follow up of starting duavee and feels better,is having fewer hot flashes and is sleeping better.She is fasting for labs.  Review of Systems  Patient denies any headaches, hearing loss, fatigue, blurred vision, shortness of breath, chest pain, abdominal pain, problems with bowel movements, urination, or intercourse. No joint pain or mood swings.Has decreased hot flashes and sleeps better. Reviewed past medical,surgical, social and family history. Reviewed medications and allergies.     Objective:   Physical Exam BP 116/62 mmHg  Pulse 76  Ht 5\' 4"  (1.626 m)  Wt 127 lb 8 oz (57.834 kg)  BMI 21.87 kg/m2  LMP 04/07/2014 (Approximate)   Skin warm and dry. Lungs: clear to ausculation bilaterally. Cardiovascular: regular rate and rhythm.She is feeling better on [facility]Duavee with less hot flashes and sleeping much better, and wants to continue them.  Assessment:     Postmenopausal Hot flashes HRT Elevated cholesterol     Plan:     Check CBC,CMP,TSH and lipids   Rx duavee 328 take 1 daily with 11 refills Follow up prn  Will talk next week when labs back

## 2014-09-08 NOTE — Patient Instructions (Signed)
Continue duavee Will talk next week about labs

## 2014-09-09 LAB — COMPREHENSIVE METABOLIC PANEL
A/G RATIO: 1.9 (ref 1.1–2.5)
ALK PHOS: 52 IU/L (ref 39–117)
ALT: 19 IU/L (ref 0–32)
AST: 21 IU/L (ref 0–40)
Albumin: 4 g/dL (ref 3.5–5.5)
BUN / CREAT RATIO: 17 (ref 9–23)
BUN: 16 mg/dL (ref 6–24)
Bilirubin Total: 0.3 mg/dL (ref 0.0–1.2)
CALCIUM: 9.2 mg/dL (ref 8.7–10.2)
CO2: 24 mmol/L (ref 18–29)
Chloride: 103 mmol/L (ref 97–108)
Creatinine, Ser: 0.93 mg/dL (ref 0.57–1.00)
GFR, EST AFRICAN AMERICAN: 82 mL/min/{1.73_m2} (ref >59)
GFR, EST NON AFRICAN AMERICAN: 71 mL/min/{1.73_m2} (ref >59)
GLOBULIN, TOTAL: 2.1 g/dL (ref 1.5–4.5)
Glucose: 88 mg/dL (ref 65–99)
Potassium: 3.8 mmol/L (ref 3.5–5.2)
SODIUM: 141 mmol/L (ref 134–144)
Total Protein: 6.1 g/dL (ref 6.0–8.5)

## 2014-09-09 LAB — CBC
HCT: 37.1 % (ref 34.0–46.6)
Hemoglobin: 12.2 g/dL (ref 11.1–15.9)
MCH: 30.8 pg (ref 26.6–33.0)
MCHC: 32.9 g/dL (ref 31.5–35.7)
MCV: 94 fL (ref 79–97)
Platelets: 309 10*3/uL (ref 150–379)
RBC: 3.96 x10E6/uL (ref 3.77–5.28)
RDW: 15.1 % (ref 12.3–15.4)
WBC: 6 10*3/uL (ref 3.4–10.8)

## 2014-09-09 LAB — TSH: TSH: 3.69 u[IU]/mL (ref 0.450–4.500)

## 2014-09-09 LAB — LIPID PANEL
CHOLESTEROL TOTAL: 249 mg/dL — AB (ref 100–199)
Chol/HDL Ratio: 3.9 ratio units (ref 0.0–4.4)
HDL: 64 mg/dL (ref >39)
LDL CALC: 173 mg/dL — AB (ref 0–99)
TRIGLYCERIDES: 59 mg/dL (ref 0–149)
VLDL Cholesterol Cal: 12 mg/dL (ref 5–40)

## 2014-09-14 ENCOUNTER — Telehealth: Payer: Self-pay | Admitting: Adult Health

## 2014-09-14 NOTE — Telephone Encounter (Signed)
Pt aware of labs, continue meds and increase exercise

## 2014-09-20 ENCOUNTER — Other Ambulatory Visit: Payer: Self-pay | Admitting: Adult Health

## 2014-09-20 DIAGNOSIS — Z1231 Encounter for screening mammogram for malignant neoplasm of breast: Secondary | ICD-10-CM

## 2014-09-30 ENCOUNTER — Other Ambulatory Visit: Payer: Self-pay | Admitting: Adult Health

## 2014-10-04 ENCOUNTER — Ambulatory Visit (HOSPITAL_COMMUNITY)
Admission: RE | Admit: 2014-10-04 | Discharge: 2014-10-04 | Disposition: A | Discharge status: Home / Self Care | Payer: BLUE CROSS/BLUE SHIELD | Source: Ambulatory Visit | Attending: Adult Health | Admitting: Adult Health

## 2014-10-04 DIAGNOSIS — Z1231 Encounter for screening mammogram for malignant neoplasm of breast: Secondary | ICD-10-CM

## 2014-10-04 DIAGNOSIS — N6489 Other specified disorders of breast: Secondary | ICD-10-CM | POA: Diagnosis not present

## 2014-10-07 ENCOUNTER — Other Ambulatory Visit: Payer: Self-pay | Admitting: Adult Health

## 2014-10-07 DIAGNOSIS — R928 Other abnormal and inconclusive findings on diagnostic imaging of breast: Secondary | ICD-10-CM

## 2014-11-02 ENCOUNTER — Ambulatory Visit (HOSPITAL_COMMUNITY)
Admission: RE | Admit: 2014-11-02 | Discharge: 2014-11-02 | Disposition: A | Discharge status: Home / Self Care | Payer: BLUE CROSS/BLUE SHIELD | Source: Ambulatory Visit | Attending: Adult Health | Admitting: Adult Health

## 2014-11-02 ENCOUNTER — Other Ambulatory Visit: Payer: Self-pay | Admitting: Adult Health

## 2014-11-02 DIAGNOSIS — N63 Unspecified lump in breast: Secondary | ICD-10-CM | POA: Diagnosis present

## 2014-11-02 DIAGNOSIS — R928 Other abnormal and inconclusive findings on diagnostic imaging of breast: Secondary | ICD-10-CM

## 2014-11-02 DIAGNOSIS — N6320 Unspecified lump in the left breast, unspecified quadrant: Secondary | ICD-10-CM

## 2015-05-03 ENCOUNTER — Encounter: Payer: Self-pay | Admitting: Internal Medicine

## 2015-05-03 ENCOUNTER — Ambulatory Visit (INDEPENDENT_AMBULATORY_CARE_PROVIDER_SITE_OTHER): Payer: BLUE CROSS/BLUE SHIELD | Admitting: Internal Medicine

## 2015-05-03 VITALS — BP 138/84 | HR 85 | Ht 64.0 in | Wt 136.0 lb

## 2015-05-03 DIAGNOSIS — R002 Palpitations: Secondary | ICD-10-CM

## 2015-05-03 DIAGNOSIS — I10 Essential (primary) hypertension: Secondary | ICD-10-CM

## 2015-05-03 NOTE — Progress Notes (Signed)
HPI Beth Vang is referred today for evaluation of palpitations. She is a pleasant 54 yo woman who carries a diagnosis of "mini strokes" who has recently developed palpitations. She is not sure that her heart is actually beating fast. However, she feels like it beats abnormally. Spells last a few minutes and occur several times a month. She is anxious about what might be causing them. She has not undergone cardiac monitoring. She denies any heart failure symptoms and does not have a family h/o sudden death. No syncope. No edema.  Allergies  Allergen Reactions  . Sulfa Antibiotics Swelling    Causes inside of mouth to swell and be sore.     Current Outpatient Prescriptions  Medication Sig Dispense Refill  . Conj Estrogens-Bazedoxifene (DUAVEE) 0.45-20 MG TABS Take 1 daily 28 tablet 11  . lisinopril (PRINIVIL,ZESTRIL) 10 MG tablet TAKE ONE TABLET BY MOUTH DAILY. 30 tablet 11  . pravastatin (PRAVACHOL) 20 MG tablet TAKE ONE TABLET BY MOUTH DAILY. 30 tablet 11  . valACYclovir (VALTREX) 1000 MG tablet TAKE 1 TABLET BY MOUTH TWICE DAILY FOR 5 DAYS 10 tablet PRN   No current facility-administered medications for this visit.     Past Medical History  Diagnosis Date  . HSV-2 (herpes simplex virus 2) infection   . Abnormal Pap smear of cervix   . Elevated cholesterol   . Hypertension   . Contraceptive management 06/25/2013  . Low serum potassium level   . Perimenopause 04/28/2014  . Post-menopausal 08/10/2014  . Hot flashes, menopausal 08/10/2014  . Hormone replacement therapy (HRT) 08/10/2014    ROS:   All systems reviewed and negative except as noted in the HPI.   Past Surgical History  Procedure Laterality Date  . Cervical cone biopsy       Family History  Problem Relation Age of Onset  . Cancer Mother     uterine, and spread  . Hypertension Father   . Hyperlipidemia Father   . Heart attack Father   . Heart disease Father   . Other Father     had 3 stents placed.    . Hypertension Sister   . Diabetes Sister   . Mental illness Sister   . Cancer Maternal Uncle   . Cancer Paternal Uncle   . Cancer Paternal Grandmother   . Cancer Paternal Grandfather      Social History   Social History  . Marital Status: Married    Spouse Name: N/A  . Number of Children: N/A  . Years of Education: N/A   Occupational History  . Not on file.   Social History Main Topics  . Smoking status: Never Smoker   . Smokeless tobacco: Never Used  . Alcohol Use: No  . Drug Use: No  . Sexual Activity: Yes    Birth Control/ Protection: None   Other Topics Concern  . Not on file   Social History Narrative     BP 138/84 mmHg  Pulse 85  Ht  (1.626 m)  Wt 136 lb (61.689 kg)  BMI 23.33 kg/m2  SpO2 95%  Physical Exam:  Well appearing NAD HEENT: Unremarkable Neck:  No JVD, no thyromegally Lymphatics:  No adenopathy Back:  No CVA tenderness Lungs:  Clear with no wheezes HEART:  Regular rate rhythm, no murmurs, no rubs, no clicks Abd:  soft, positive bowel sounds, no organomegally, no rebound, no guarding Ext:  2 plus pulses, no edema, no cyanosis, no clubbing Skin:  No  rashes no nodules Neuro:  CN II through XII intact, motor grossly intact  EKG - nsr  Assess/Plan:

## 2015-05-03 NOTE — Patient Instructions (Signed)
Your physician recommends that you schedule a follow-up appointment in: 6-8 weeks with Dr. Ladona Ridgelaylor.  Your physician recommends that you continue on your current medications as directed. Please refer to the Current Medication list given to you today.  Your physician has recommended that you wear an event monitor. Event monitors are medical devices that record the heart's electrical activity. Doctors most often us these monitors to diagnose arrhythmias. Arrhythmias are problems with the speed or rhythm of the heartbeat. The monitor is a small, portable device. You can wear one while you do your normal daily activities. This is usually used to diagnose what is causing palpitations/syncope (passing out).   If you need a refill on your cardiac medications before your next appointment, please call your pharmacy.  Thank you for choosing [facility]!

## 2015-05-04 DIAGNOSIS — R002 Palpitations: Secondary | ICD-10-CM | POA: Insufficient documentation

## 2015-05-04 NOTE — Assessment & Plan Note (Signed)
Her blood pressure is well controlled. She will continue her current meds.  

## 2015-05-04 NOTE — Assessment & Plan Note (Signed)
The etiology of her symptoms is unclear. We discussed watchful waiting vs cardiac monitoring. She wishes to undergo cardiac monitoring.  Will see her back after several weeks.

## 2015-05-06 ENCOUNTER — Ambulatory Visit (INDEPENDENT_AMBULATORY_CARE_PROVIDER_SITE_OTHER): Payer: BLUE CROSS/BLUE SHIELD

## 2015-05-06 DIAGNOSIS — R002 Palpitations: Secondary | ICD-10-CM

## 2015-05-19 ENCOUNTER — Encounter: Payer: Self-pay | Admitting: Internal Medicine

## 2015-06-17 ENCOUNTER — Ambulatory Visit: Payer: BLUE CROSS/BLUE SHIELD | Admitting: Internal Medicine

## 2015-08-18 ENCOUNTER — Ambulatory Visit (INDEPENDENT_AMBULATORY_CARE_PROVIDER_SITE_OTHER): Payer: BLUE CROSS/BLUE SHIELD | Admitting: Internal Medicine

## 2015-08-18 ENCOUNTER — Encounter: Payer: Self-pay | Admitting: Internal Medicine

## 2015-08-18 VITALS — BP 112/64 | HR 81 | Ht 64.0 in | Wt 134.0 lb

## 2015-08-18 DIAGNOSIS — R002 Palpitations: Secondary | ICD-10-CM

## 2015-08-18 NOTE — Progress Notes (Signed)
HPI [name] returns today for evaluation of palpitations. She is a pleasant 55 yo woman who carries a diagnosis of "mini strokes" who I saw for palpitations and had wear a cardiac monitor. She had no arrhythmias and no ectopy. 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 Allergies  Allergen Reactions  . Sulfa Antibiotics Swelling    Causes inside of mouth to swell and be sore.     Current Outpatient Prescriptions  Medication Sig Dispense Refill  . Conj Estrogens-Bazedoxifene (DUAVEE) 0.45-20 MG TABS Take 1 daily 28 tablet 11  . lisinopril (PRINIVIL,ZESTRIL) 10 MG tablet TAKE ONE TABLET BY MOUTH DAILY. 30 tablet 11  . pravastatin (PRAVACHOL) 20 MG tablet TAKE ONE TABLET BY MOUTH DAILY. 30 tablet 11  . valACYclovir (VALTREX) 1000 MG tablet TAKE 1 TABLET BY MOUTH TWICE DAILY FOR 5 DAYS 10 tablet PRN   No current facility-administered medications for this visit.     Past Medical History  Diagnosis Date  . HSV-2 (herpes simplex virus 2) infection   . Abnormal Pap smear of cervix   . Elevated cholesterol   . Hypertension   . Contraceptive management 06/25/2013  . Low serum potassium level   . Perimenopause 04/28/2014  . Post-menopausal 08/10/2014  . Hot flashes, menopausal 08/10/2014  . Hormone replacement therapy (HRT) 08/10/2014    ROS:   All systems reviewed and negative except as noted in the HPI.   Past Surgical History  Procedure Laterality Date  . Cervical cone biopsy       Family History  Problem Relation Age of Onset  . Cancer Mother     uterine, and spread  . Hypertension Father   . Hyperlipidemia Father   . Heart attack Father   . Heart disease Father   . Other Father     had 3 stents placed.  . Hypertension Sister   . Diabetes Sister   . Mental illness Sister   . Cancer Maternal Uncle   . Cancer Paternal Uncle   . Cancer Paternal Grandmother   . Cancer Paternal Grandfather      Social History    Social History  . Marital Status: Married    Spouse Name: N/A  . Number of Children: N/A  . Years of Education: N/A   Occupational History  . Not on file.   Social History Main Topics  . Smoking status: Never Smoker   . Smokeless tobacco: Never Used  . Alcohol Use: No  . Drug Use: No  . Sexual Activity: Yes    Birth Control/ Protection: None   Other Topics Concern  . Not on file   Social History Narrative     BP 112/64 mmHg  Pulse 81  Ht 5\' 4"  (1.626 m)  Wt 134 lb (60.782 kg)  BMI 22.99 kg/m2  SpO2 99%  Physical Exam:  Well appearing middle aged woman, NAD HEENT: Unremarkable Neck:  6 cm JVD, no thyromegally Lymphatics:  No adenopathy Back:  No CVA tenderness Lungs:  Clear with no wheezes HEART:  Regular rate rhythm, no murmurs, no rubs, no clicks Abd:  soft, positive bowel sounds, no organomegally, no rebound, no guarding Ext:  2 plus pulses, no edema, no cyanosis, no clubbing Skin:  No rashes no nodules Neuro:  CN II through XII intact, motor grossly intact  Cardiac monitor - reviewed. No atrial or ventricular arrhythmias  Assess/Plan: 1. Palpitations - she has had  an improvement in her symptoms and had no arrhythmias on cardiac monitoring. I have asked the patient to avoid caffeine and ETOH and to undergo watchful waiting. She will call us if her symptoms return. 2. HTN - her blood pressure is good. She will continue her current meds. 3. Dyslipidemia -she will continue her statin therapy.  Beth Vang.D.

## 2015-08-18 NOTE — Patient Instructions (Signed)
Your physician recommends that you schedule a follow-up appointment in: As needed  Your physician recommends that you continue on your current medications as directed. Please refer to the Current Medication list given to you today.  Thank you for choosing [facility]!    

## 2015-09-30 ENCOUNTER — Other Ambulatory Visit: Payer: Self-pay | Admitting: Adult Health

## 2015-11-02 ENCOUNTER — Other Ambulatory Visit: Payer: Self-pay | Admitting: Adult Health

## 2015-11-16 ENCOUNTER — Other Ambulatory Visit: Payer: BLUE CROSS/BLUE SHIELD | Admitting: Adult Health

## 2015-11-30 ENCOUNTER — Ambulatory Visit (INDEPENDENT_AMBULATORY_CARE_PROVIDER_SITE_OTHER): Payer: BLUE CROSS/BLUE SHIELD | Admitting: Adult Health

## 2015-11-30 ENCOUNTER — Other Ambulatory Visit: Payer: Self-pay | Admitting: Adult Health

## 2015-11-30 ENCOUNTER — Other Ambulatory Visit (HOSPITAL_COMMUNITY)
Admission: RE | Admit: 2015-11-30 | Discharge: 2015-11-30 | Disposition: A | Discharge status: Home / Self Care | Payer: BLUE CROSS/BLUE SHIELD | Source: Ambulatory Visit | Attending: Adult Health | Admitting: Adult Health

## 2015-11-30 ENCOUNTER — Encounter: Payer: Self-pay | Admitting: Adult Health

## 2015-11-30 VITALS — BP 120/78 | HR 78 | Ht 63.0 in | Wt 141.5 lb

## 2015-11-30 DIAGNOSIS — Z1211 Encounter for screening for malignant neoplasm of colon: Secondary | ICD-10-CM | POA: Diagnosis not present

## 2015-11-30 DIAGNOSIS — Z1151 Encounter for screening for human papillomavirus (HPV): Secondary | ICD-10-CM | POA: Insufficient documentation

## 2015-11-30 DIAGNOSIS — Z79890 Hormone replacement therapy (postmenopausal): Secondary | ICD-10-CM

## 2015-11-30 DIAGNOSIS — Z01419 Encounter for gynecological examination (general) (routine) without abnormal findings: Secondary | ICD-10-CM | POA: Diagnosis not present

## 2015-11-30 DIAGNOSIS — Z780 Asymptomatic menopausal state: Secondary | ICD-10-CM

## 2015-11-30 DIAGNOSIS — Z113 Encounter for screening for infections with a predominantly sexual mode of transmission: Secondary | ICD-10-CM | POA: Insufficient documentation

## 2015-11-30 LAB — HEMOCCULT GUIAC POC 1CARD (OFFICE): FECAL OCCULT BLD: NEGATIVE

## 2015-11-30 MED ORDER — CONJ ESTROGENS-BAZEDOXIFENE 0.45-20 MG PO TABS: 1.0000 | ORAL_TABLET | Freq: Every day | ORAL | Status: DC

## 2015-11-30 NOTE — Patient Instructions (Signed)
Physical in 1 year, pap in 3 years Mammogram yearly Colonoscopy advised

## 2015-11-30 NOTE — Progress Notes (Signed)
Patient ID: Beth Vang, female   DOB: 03/12/1961, 55 y.o.   MRN: [id number] History of Present Illness: Beth Vang is a 55 year old black female in for a well woman gyn exam and pap. She is happy with HRT.  Current Medications, Allergies, Past Medical History, Past Surgical History, Family History and Social History were reviewed in [facility] electronic medical record.     Review of Systems: Patient denies any headaches, hearing loss, fatigue, blurred vision, shortness of breath, chest pain, abdominal pain, problems with bowel movements, urination, or intercourse. No joint pain or mood swings.    Physical Exam:BP 120/78 mmHg  Pulse 78  Ht 5\' 3"  (1.6 m)  Wt 141 lb 8 oz (64.184 kg)  BMI 25.07 kg/m2  LMP 04/07/2014 (Approximate) General:  Well developed, well nourished, no acute distress Skin:  Warm and dry Neck:  Midline trachea, normal thyroid, good ROM, no lymphadenopathy Lungs; Clear to auscultation bilaterally Breast:  No dominant palpable mass, retraction, or nipple discharge Cardiovascular: Regular rate and rhythm Abdomen:  Soft, non tender, no hepatosplenomegaly Pelvic:  External genitalia is normal in appearance, no lesions.  The vagina is normal in appearance. Urethra has no lesions or masses. The cervix is bulbous. Pap with HPV and GC/CHL. Uterus is felt to be normal size, shape, and contour.  No adnexal masses or tenderness noted.Bladder is non tender, no masses felt. Rectal: Good sphincter tone, no polyps, or hemorrhoids felt.  Hemoccult negative. Extremities/musculoskeletal:  No swelling or varicosities noted, no clubbing or cyanosis Psych:  No mood changes, alert and cooperative,seems happy   Impression: Well woman gyn exam and pap PM HRT    Plan: Check CBC,CMP,TSH and lipids,A1c and vitamin D, HIV and Hept C Physical in 1 year, pap in 3 if normal Mammogram yearly Colonoscopy advised Refilled Duavee x 1 year

## 2015-12-01 LAB — COMPREHENSIVE METABOLIC PANEL
A/G RATIO: 2.1 (ref 1.2–2.2)
ALT: 13 IU/L (ref 0–32)
AST: 21 IU/L (ref 0–40)
Albumin: 4.5 g/dL (ref 3.5–5.5)
Alkaline Phosphatase: 49 IU/L (ref 39–117)
BUN/Creatinine Ratio: 14 (ref 9–23)
BUN: 13 mg/dL (ref 6–24)
Bilirubin Total: 0.5 mg/dL (ref 0.0–1.2)
CALCIUM: 9.3 mg/dL (ref 8.7–10.2)
CO2: 25 mmol/L (ref 18–29)
CREATININE: 0.91 mg/dL (ref 0.57–1.00)
Chloride: 101 mmol/L (ref 96–106)
GFR, EST AFRICAN AMERICAN: 83 mL/min/{1.73_m2} (ref >59)
GFR, EST NON AFRICAN AMERICAN: 72 mL/min/{1.73_m2} (ref >59)
GLOBULIN, TOTAL: 2.1 g/dL (ref 1.5–4.5)
Glucose: 78 mg/dL (ref 65–99)
POTASSIUM: 3.8 mmol/L (ref 3.5–5.2)
SODIUM: 141 mmol/L (ref 134–144)
TOTAL PROTEIN: 6.6 g/dL (ref 6.0–8.5)

## 2015-12-01 LAB — HIV ANTIBODY (ROUTINE TESTING W REFLEX): HIV SCREEN 4TH GENERATION: NONREACTIVE

## 2015-12-01 LAB — TSH: TSH: 2.74 u[IU]/mL (ref 0.450–4.500)

## 2015-12-01 LAB — CBC
HEMATOCRIT: 39.1 % (ref 34.0–46.6)
HEMOGLOBIN: 12.8 g/dL (ref 11.1–15.9)
MCH: 31.1 pg (ref 26.6–33.0)
MCHC: 32.7 g/dL (ref 31.5–35.7)
MCV: 95 fL (ref 79–97)
Platelets: 321 10*3/uL (ref 150–379)
RBC: 4.11 x10E6/uL (ref 3.77–5.28)
RDW: 14.9 % (ref 12.3–15.4)
WBC: 5.9 10*3/uL (ref 3.4–10.8)

## 2015-12-01 LAB — LIPID PANEL
CHOL/HDL RATIO: 3.5 ratio (ref 0.0–4.4)
CHOLESTEROL TOTAL: 249 mg/dL — AB (ref 100–199)
HDL: 71 mg/dL (ref >39)
LDL CALC: 161 mg/dL — AB (ref 0–99)
TRIGLYCERIDES: 83 mg/dL (ref 0–149)
VLDL CHOLESTEROL CAL: 17 mg/dL (ref 5–40)

## 2015-12-01 LAB — VITAMIN D 25 HYDROXY (VIT D DEFICIENCY, FRACTURES): Vit D, 25-Hydroxy: 25.8 ng/mL — ABNORMAL LOW (ref 30.0–100.0)

## 2015-12-01 LAB — HEPATITIS C ANTIBODY: Hep C Virus Ab: <0.1 s/co ratio (ref 0.0–0.9)

## 2015-12-02 LAB — CYTOLOGY - PAP

## 2015-12-10 ENCOUNTER — Telehealth: Payer: Self-pay | Admitting: Adult Health

## 2015-12-10 MED ORDER — CHOLECALCIFEROL 125 MCG (5000 UT) PO CAPS: 5000.0000 [IU] | ORAL_CAPSULE | Freq: Every day | ORAL | Status: DC

## 2015-12-10 NOTE — Telephone Encounter (Signed)
Left message that labs were good, just take vitamin D3 5000 IU every day as level was a little low

## 2016-01-31 ENCOUNTER — Other Ambulatory Visit: Payer: Self-pay | Admitting: Adult Health

## 2016-07-07 IMAGING — US US BREAST LTD UNI LEFT INC AXILLA
1 series · 11 of 11 positions shown · non-contrast
Comparison: Prior exams

CLINICAL DATA: Call back from screening for a possible left breast
mass.

EXAM:
DIGITAL DIAGNOSTIC LEFT MAMMOGRAM WITH 3D TOMOSYNTHESIS WITH CAD
ULTRASOUND LEFT BREAST

[Series 1: us breast ltd uni left inc axilla · 0.06mm/px · 11 of 11 slices shown]
[im 1/11]
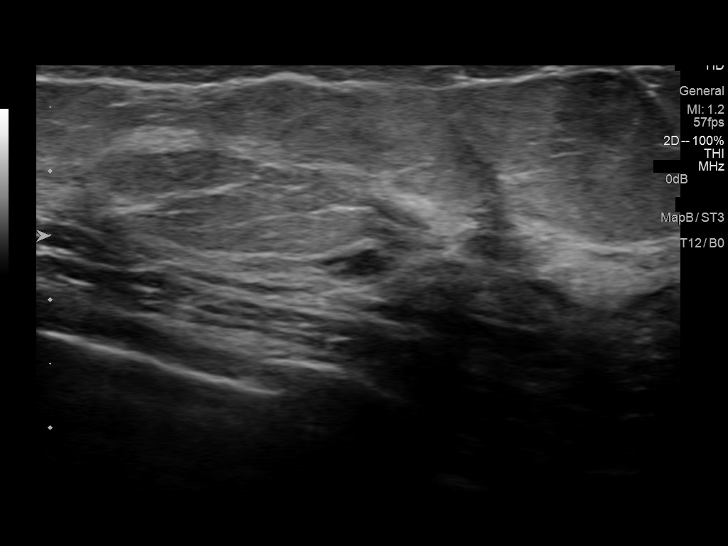
[im 2/11]
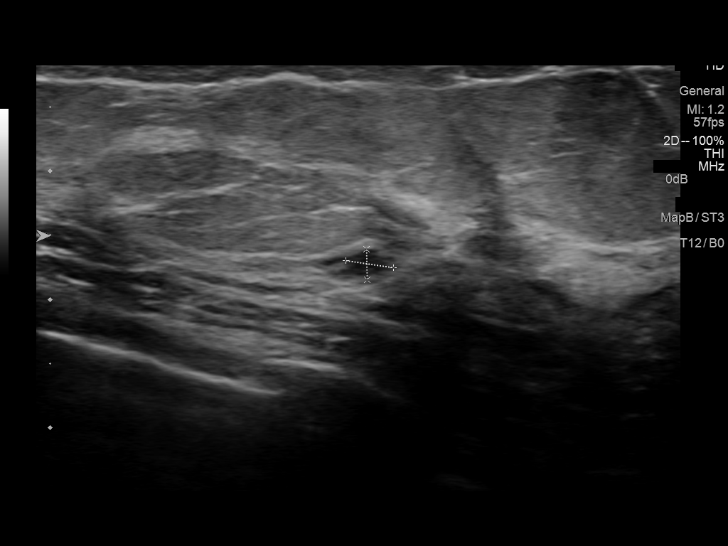
[im 3/11]
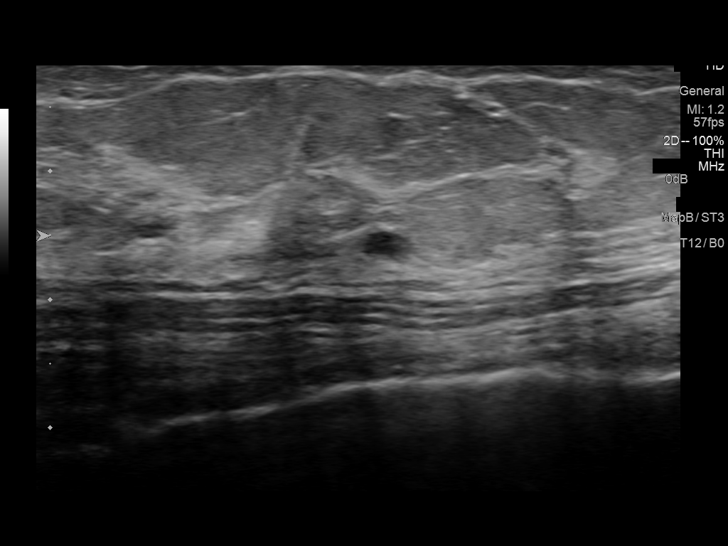
[im 4/11]
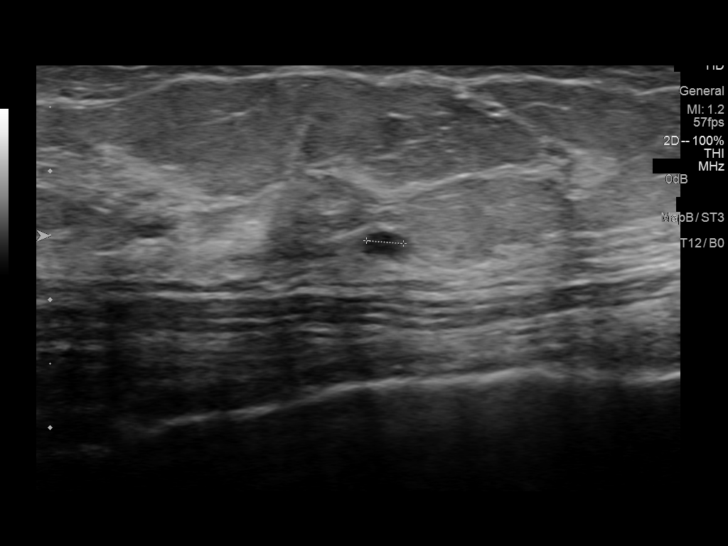
[im 5/11]
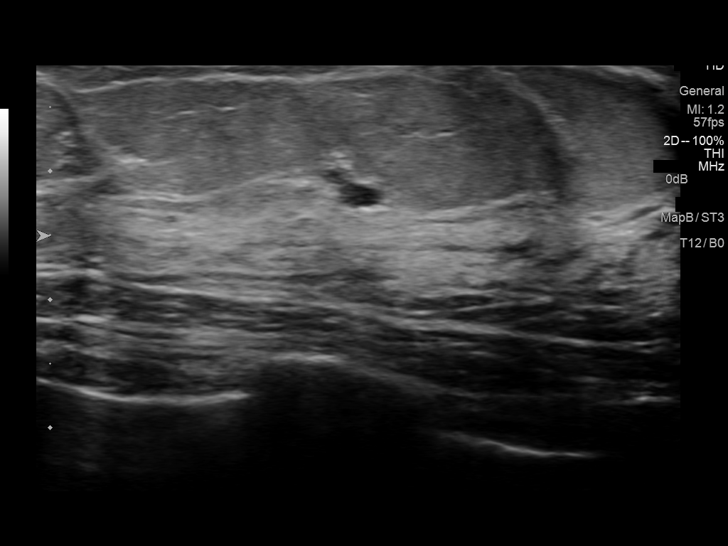
[im 6/11]
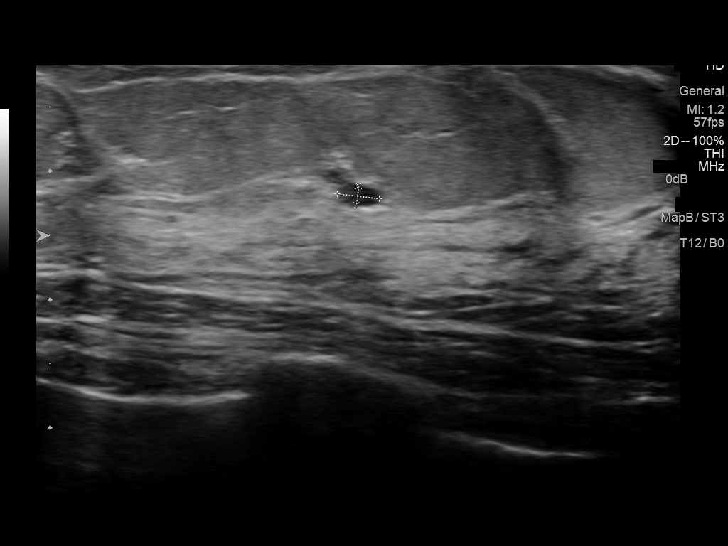
[im 7/11]
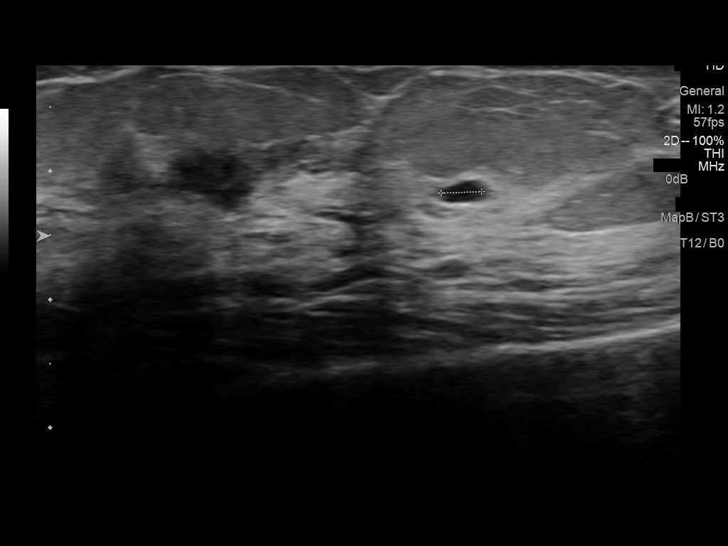
[im 8/11]
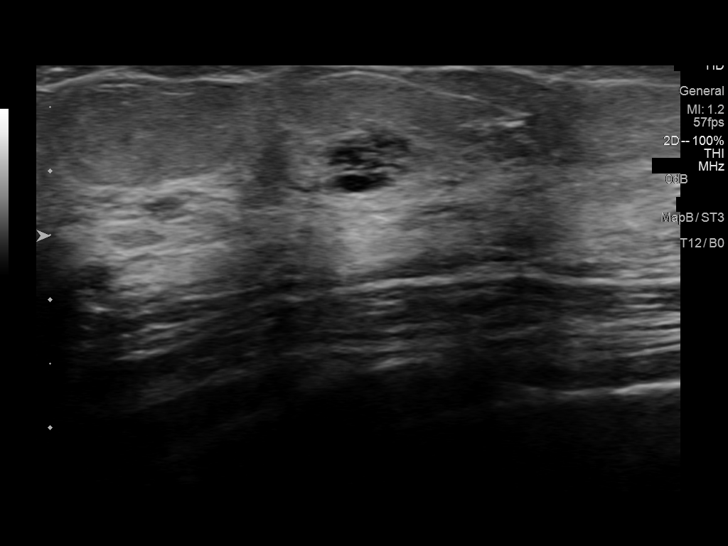
[im 9/11]
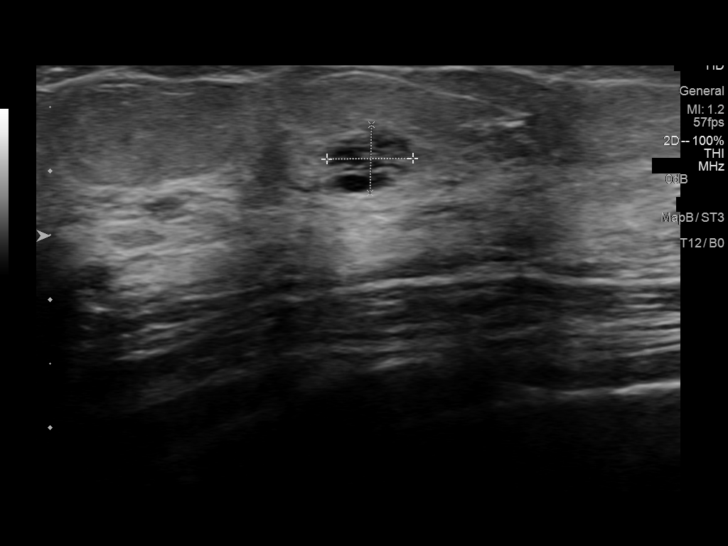
[im 10/11]
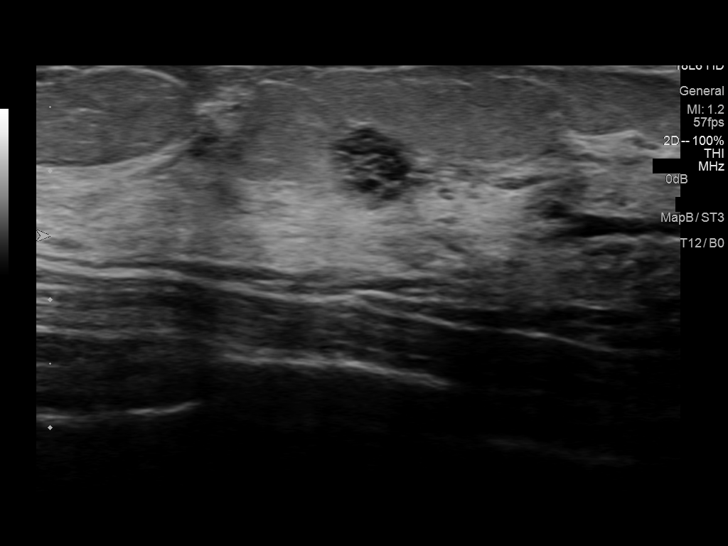
[im 11/11]
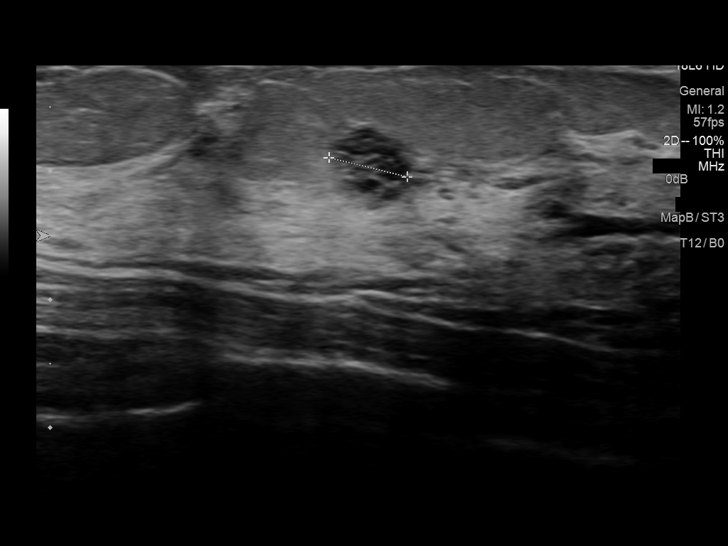

[11 of 11 positions shown; findings below may reference images not displayed]

ACR Breast Density Category c: The breast tissue is heterogeneously
dense, which may obscure small masses.
FINDINGS: An oval mass, relatively lucent on the MLO view, persists on the
followup 2D and 3D left breast diagnostic images. This lies in the
12 o'clock position, middle depth. This measures approximately 8 mm
in size.

Mammographic images were processed with CAD.

On physical exam, no mass is palpated in the upper left breast.

Targeted ultrasound is performed, showing several small cysts. The
largest lesion is a cluster of cysts in the 12 o'clock position, 2
cm the nipple, measuring 7 mm x 5 mm x 6 mm, consistent in size,
shape and position to the mammographic abnormality. There are no
solid masses or suspicious lesions.
IMPRESSION: Benign left breast cysts.  No evidence of malignancy.

RECOMMENDATION:
Screening mammogram in one year.(Code:[id number])

I have discussed the findings and recommendations with the patient.
Results were also provided in writing at the conclusion of the
visit. If applicable, a reminder letter will be sent to the patient
regarding the next appointment.

BI-RADS CATEGORY  2: Benign.

## 2016-10-14 ENCOUNTER — Encounter (HOSPITAL_COMMUNITY): Payer: Self-pay | Admitting: Emergency Medicine

## 2016-10-14 ENCOUNTER — Emergency Department (HOSPITAL_COMMUNITY)
Admission: EM | Admit: 2016-10-14 | Discharge: 2016-10-14 | Disposition: A | Discharge status: Home / Self Care | Payer: BLUE CROSS/BLUE SHIELD | Attending: Emergency Medicine | Admitting: Emergency Medicine

## 2016-10-14 DIAGNOSIS — I10 Essential (primary) hypertension: Secondary | ICD-10-CM | POA: Insufficient documentation

## 2016-10-14 DIAGNOSIS — Z79899 Other long term (current) drug therapy: Secondary | ICD-10-CM | POA: Insufficient documentation

## 2016-10-14 DIAGNOSIS — R252 Cramp and spasm: Secondary | ICD-10-CM | POA: Diagnosis not present

## 2016-10-14 DIAGNOSIS — M79671 Pain in right foot: Secondary | ICD-10-CM | POA: Diagnosis present

## 2016-10-14 NOTE — ED Provider Notes (Signed)
[facility]-EMERGENCY DEPT Provider Note   CSN: [id number] Arrival date & time: 10/14/16  1733     History   Chief Complaint Chief Complaint  Patient presents with  . Foot Pain    HPI Beth Vang is a 56 y.o. female.  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 She felt a small amount of numbness along the distal medial aspect of the foot. No facial asymmetry, limb weakness, confusion, gross neurological deficits. She is feeling normalnow.  Nothing makes symptoms better or worse.      Past Medical History:  Diagnosis Date  . Abnormal Pap smear of cervix   . Contraceptive management 06/25/2013  . Elevated cholesterol   . Hormone replacement therapy (HRT) 08/10/2014  . Hot flashes, menopausal 08/10/2014  . HSV-2 (herpes simplex virus 2) infection   . Hypertension   . Low serum potassium level   . Perimenopause 04/28/2014  . Post-menopausal 08/10/2014    Patient Active Problem List   Diagnosis Date Noted  . Palpitations 05/04/2015  . Post-menopausal 08/10/2014  . Hot flashes, menopausal 08/10/2014  . Hormone replacement therapy (HRT) 08/10/2014  . Perimenopause 04/28/2014  . Elevated cholesterol 06/25/2013  . Hypertension 06/25/2013  . Contraceptive management 06/25/2013    Past Surgical History:  Procedure Laterality Date  . CERVICAL CONE BIOPSY      OB History    Gravida Para Term Preterm AB Living   2 2       2    SAB TAB Ectopic Multiple Live Births                   Home Medications    Prior to Admission medications   Medication Sig Start Date End Date Taking? Authorizing Provider  Cholecalciferol 5000 units capsule Take 1 capsule (5,000 Units total) by mouth daily. 12/10/15   Adline PotterGriffin, Jennifer A, NP  DUAVEE 0.45-20 MG TABS TAKE 1 TABLET BY MOUTH EVERY DAY 02/01/16   Adline PotterGriffin, Jennifer A, NP  Multiple Vitamins-Minerals (HAIR SKIN NAILS PO) Take by mouth 3 (three) times daily.     [provider]  valACYclovir (VALTREX) 1000 MG tablet Take 1,000 mg by mouth 2 (two) times daily as needed.    [provider]    Family History Family History  Problem Relation Age of Onset  . Cancer Mother        uterine, and spread  . Hypertension Father   . Hyperlipidemia Father   . Heart attack Father   . Heart disease Father   . Other Father        had 3 stents placed.  . Hypertension Sister   . Diabetes Sister   . Mental illness Sister   . Cancer Maternal Uncle   . Cancer Paternal Uncle   . Cancer Paternal Grandmother   . Cancer Paternal Grandfather     Social History Social History  Substance Use Topics  . Smoking status: Never Smoker  . Smokeless tobacco: Never Used  . Alcohol use No     Allergies   Sulfa antibiotics   Review of Systems Review of Systems  All other systems reviewed and are negative.    Physical Exam Updated Vital Signs BP (!) 162/88 (BP Location: Right Arm)   Pulse 88   Temp 98 F (36.7 C) (Oral)   Resp 18   Ht 5\' 4"  (1.626 m)   Wt 62.6 kg (138 lb)   LMP  04/07/2014 (Approximate)   SpO2 99%   BMI 23.69 kg/m   Physical Exam  Constitutional: She is oriented to person, place, and time. She appears well-developed and well-nourished.  HENT:  Head: Normocephalic and atraumatic.  Eyes: Conjunctivae are normal.  Neck: Neck supple.  Musculoskeletal: Normal range of motion.  Neurological: She is alert and oriented to person, place, and time.  Skin: Skin is warm and dry.  Psychiatric: She has a normal mood and affect. Her behavior is normal.  Nursing note and vitals reviewed.    ED Treatments / Results  Labs (all labs ordered are listed, but only abnormal results are displayed) Labs Reviewed - No data to display  EKG  EKG Interpretation None       Radiology No results found.  Procedures Procedures (including critical care time)  Medications Ordered in ED Medications - No data to  display   Initial Impression / Assessment and Plan / ED Course  I have reviewed the triage vital signs and the nursing notes.  Pertinent labs & imaging results that were available during my care of the patient were reviewed by me and considered in my medical decision making (see chart for details).     Patient has normal neurological exam and normal foot exam. No evidence of infection or neuro deficits.  Final Clinical Impressions(s) / ED Diagnoses   Final diagnoses:  Foot spasms    New Prescriptions New Prescriptions   No medications on file     Donnetta Hutching, MD 10/14/16 [phone]

## 2016-10-14 NOTE — ED Triage Notes (Signed)
Pt was at work today and pt states her " toes spread apart and then went back to normal" 2/10 pain. No injury.

## 2016-10-14 NOTE — Discharge Instructions (Signed)
I see no evidence of a stroke or mini stroke. Follow-up your primary care doctor.

## 2016-12-20 ENCOUNTER — Other Ambulatory Visit: Payer: Self-pay | Admitting: Adult Health

## 2016-12-24 ENCOUNTER — Telehealth: Payer: Self-pay | Admitting: *Deleted

## 2016-12-24 NOTE — Telephone Encounter (Signed)
Left message that valtrex refilled

## 2016-12-24 NOTE — Telephone Encounter (Signed)
Requesting refill on Valtrex.

## 2018-08-08 ENCOUNTER — Encounter: Payer: Self-pay | Admitting: Obstetrics & Gynecology

## 2018-08-08 ENCOUNTER — Other Ambulatory Visit: Payer: Self-pay

## 2018-08-08 ENCOUNTER — Ambulatory Visit: Payer: 59 | Admitting: Obstetrics & Gynecology

## 2018-08-08 VITALS — BP 152/88 | HR 74 | Ht 64.0 in | Wt 143.5 lb

## 2018-08-08 DIAGNOSIS — A599 Trichomoniasis, unspecified: Secondary | ICD-10-CM | POA: Diagnosis not present

## 2018-08-08 MED ORDER — METRONIDAZOLE 500 MG PO TABS: 500.0000 mg | ORAL_TABLET | Freq: Two times a day (BID) | ORAL | 1 refills | Status: DC

## 2018-08-08 NOTE — Progress Notes (Signed)
       Chief Complaint  Patient presents with  . Vaginal Discharge    Blood pressure (!) 152/88, pulse 74, height 5\' 4"  (1.626 m), weight 143 lb 8 oz (65.1 kg), last menstrual period 04/07/2014.  58 y.o. G2P2 Patient's last menstrual period was 04/07/2014 (approximate). The current method of family planning is none.  Subjective Vaginal discharge for 1days Itching no Irritation no Odor yes Similar to previous no  Previous treatment n/a  Objective Vulva:  normal appearing vulva with no masses, tenderness or lesions Vagina:  normal mucosa, thin grey discharge Cervix:  NO CMT Uterus:  normal size, contour, position, consistency, mobility, non-tender Adnexa: ovaries:present,  normal adnexa in size, nontender and no masses     Pertinent ROS No burning with urination, frequency or urgency No nausea, vomiting or diarrhea Nor fever chills or other constitutional symptoms   Labs or studies Wet Prep:   A sample of vaginal discharge was obtained from the posterior fornix using a cotton swab. 2 drops of saline were placed on a slide and the cotton swab was immersed in the saline. Microscopic evaluation was performed and results were as follows:  Negative  for yeast  Negative for clue cells , consistent with Bacterial vaginosis Positive for trichomonas  elevated WBC population   Whiff test: Negative     Impression Diagnoses this Encounter::   ICD-10-CM   1. Trichomonas vaginalis infection A59.9     Established relevant diagnosis(es):   Plan/Recommendations: Meds ordered this encounter  Medications  . metroNIDAZOLE (FLAGYL) 500 MG tablet    Sig: Take 1 tablet (500 mg total) by mouth 2 (two) times daily.    Dispense:  14 tablet    Refill:  1    Please give refill for sexual partner    Labs or Scans Ordered: No orders of the defined types were placed in this encounter.   Management:: >refill for partner >no sex for 3 weeks >POC 3 weeks  Follow up  Return in about 3 weeks (around 08/29/2018) for Follow up, with Dr Despina Hidden.      All questions were answered.

## 2018-08-08 NOTE — Patient Instructions (Signed)
Trichomoniasis Trichomoniasis is an STI (sexually transmitted infection) that can affect both women and men. In women, the outer area of the female genitalia (vulva) and the vagina are affected. In men, the penis is mainly affected, but the prostate and other reproductive organs can also be involved. This condition can be treated with medicine. It often has no symptoms (is asymptomatic), especially in men. What are the causes? This condition is caused by an organism called Trichomonas vaginalis. Trichomoniasis most often spreads from person to person (is contagious) through sexual contact. What increases the risk? The following factors may make you more likely to develop this condition:  Having unprotected sexual intercourse.  Having sexual intercourse with a partner who has trichomoniasis.  Having multiple sexual partners.  Having had previous trichomoniasis infections or other STIs. What are the signs or symptoms? In women, symptoms of trichomoniasis include:  Abnormal vaginal discharge that is clear, white, gray, or yellow-green and foamy and has an unusual "fishy" odor.  Itching and irritation of the vagina and vulva.  Burning or pain during urination or sexual intercourse.  Genital redness and swelling. In men, symptoms of trichomoniasis include:  Penile discharge that may be foamy or contain pus.  Pain in the penis. This may happen only when urinating.  Itching or irritation inside the penis.  Burning after urination or ejaculation. How is this diagnosed? In women, this condition may be found during a routine Pap test or physical exam. It may be found in men during a routine physical exam. Your health care provider may perform tests to help diagnose this infection, such as:  Urine tests (men and women).  The following in women: ? Testing the pH of the vagina. ? A vaginal swab test that checks for the Trichomonas vaginalis organism. ? Testing vaginal secretions. Your  health care provider may test you for other STIs, including HIV (human immunodeficiency virus). How is this treated? This condition is treated with medicine taken by mouth (orally), such as metronidazole or tinidazole to fight the infection. Your sexual partner(s) may also need to be tested and treated.  If you are a woman and you plan to become pregnant or think you may be pregnant, tell your health care provider right away. Some medicines that are used to treat the infection should not be taken during pregnancy. Your health care provider may recommend over-the-counter medicines or creams to help relieve itching or irritation. You may be tested for infection again 3 months after treatment. Follow these instructions at home:  Take and use over-the-counter and prescription medicines, including creams, only as told by your health care provider.  Do not have sexual intercourse until one week after you finish your medicine, or until your health care provider approves. Ask your health care provider when you may resume sexual intercourse.  (Women) Do not douche or wear tampons while you have the infection.  Discuss your infection with your sexual partner(s). Make sure that your partner gets tested and treated, if necessary.  Keep all follow-up visits as told by your health care provider. This is important. How is this prevented?  Use condoms every time you have sex. Using condoms correctly and consistently can help protect against STIs.  Avoid having multiple sexual partners.  Talk with your sexual partner about any symptoms that either of you may have, as well as any history of STIs.  Get tested for STIs and STDs (sexually transmitted diseases) before you have sex. Ask your partner to do the same.    Do not have sexual contact if you have symptoms of trichomoniasis or another STI. Contact a health care provider if:  You still have symptoms after you finish your medicine.  You develop pain in  your abdomen.  You have pain when you urinate.  You have bleeding after sexual intercourse.  You develop a rash.  You feel nauseous or you vomit.  You plan to become pregnant or think you may be pregnant. Summary  Trichomoniasis is an STI (sexually transmitted infection) that can affect both women and men.  This condition often has no symptoms (is asymptomatic), especially in men.  You should not have sexual intercourse until one week after you finish your medicine, or until your health care provider approves. Ask your health care provider when you may resume sexual intercourse.  Discuss your infection with your sexual partner. Make sure that your partner gets tested and treated, if necessary. This information is not intended to replace advice given to you by your health care provider. Make sure you discuss any questions you have with your health care provider. Document Released: 10/31/2000 Document Revised: 03/30/2016 Document Reviewed: 03/30/2016 Elsevier Interactive Patient Education  2019 Elsevier Inc.  

## 2018-08-28 ENCOUNTER — Telehealth: Payer: Self-pay | Admitting: *Deleted

## 2018-08-28 NOTE — Telephone Encounter (Signed)

## 2018-09-01 ENCOUNTER — Ambulatory Visit: Payer: 59 | Admitting: Obstetrics & Gynecology

## 2018-09-10 ENCOUNTER — Telehealth: Payer: Self-pay | Admitting: *Deleted

## 2018-09-10 NOTE — Telephone Encounter (Signed)

## 2018-09-11 ENCOUNTER — Encounter: Payer: Self-pay | Admitting: Obstetrics & Gynecology

## 2018-09-11 ENCOUNTER — Ambulatory Visit: Payer: 59 | Admitting: Obstetrics & Gynecology

## 2019-01-05 ENCOUNTER — Ambulatory Visit
Admission: EM | Admit: 2019-01-05 | Discharge: 2019-01-05 | Disposition: A | Discharge status: Home / Self Care | Payer: Worker's Compensation | Attending: Emergency Medicine | Admitting: Emergency Medicine

## 2019-01-05 ENCOUNTER — Ambulatory Visit (INDEPENDENT_AMBULATORY_CARE_PROVIDER_SITE_OTHER)

## 2019-01-05 ENCOUNTER — Ambulatory Visit: Payer: 59

## 2019-01-05 ENCOUNTER — Other Ambulatory Visit: Payer: Self-pay

## 2019-01-05 DIAGNOSIS — Y990 Civilian activity done for income or pay: Secondary | ICD-10-CM

## 2019-01-05 DIAGNOSIS — S52532A Colles' fracture of left radius, initial encounter for closed fracture: Secondary | ICD-10-CM

## 2019-01-05 DIAGNOSIS — W19XXXA Unspecified fall, initial encounter: Secondary | ICD-10-CM

## 2019-01-05 MED ORDER — HYDROCODONE-ACETAMINOPHEN 5-325 MG PO TABS: 1.0000 | ORAL_TABLET | Freq: Every evening | ORAL | 0 refills | Status: DC | PRN

## 2019-01-05 MED ORDER — NAPROXEN 375 MG PO TABS: 375.0000 mg | ORAL_TABLET | Freq: Two times a day (BID) | ORAL | 0 refills | Status: DC

## 2019-01-05 NOTE — Discharge Instructions (Signed)
X-rays showed distal radius fracture Wrist splint placed Continue conservative management of rest, ice, and elevation Take naproxen as needed for pain relief (may cause abdominal discomfort, ulcers, and GI bleeds avoid taking with other NSAIDs) Take norco as needed for severe break-through pain.  DO NOT TAKE prior to driving or operating heavy machinery Follow up with orthopedist for further evaluation and managment Return or go to the ER if you have any new or worsening symptoms (fever, chills, increased swelling, redness, numbness/ tingling, chest pain, abdominal pain, symptoms do not improve with medications, etc...)

## 2019-01-05 NOTE — ED Triage Notes (Signed)
Pt injured left wrist delivering mail, swelling noted to left wrist

## 2019-01-05 NOTE — ED Provider Notes (Signed)
[facility] [place] [facility]   [id number] 01/05/19 Arrival Time: 1310  CC: Left wrist pain  SUBJECTIVE: History from: patient. Beth Vang is a 58 y.o. female complains of left wrist injury/ pain that occurred yesterday at work.  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  Dog scarred her and she fell back landing on left wrist.  Localizes the pain to the outside of wrist.  Describes the pain as intermittent and achy in character.  Has tried OTC medications like ibuprofen with minimal relief.  Symptoms are made worse with wrist ROM.  Denies similar symptoms in the past.  Complains of associated swelling and weakness.  Denies fever, chills, erythema, ecchymosis.  ROS: As per HPI.  All other pertinent ROS negative.     Past Medical History:  Diagnosis Date  . Abnormal Pap smear of cervix   . Contraceptive management 06/25/2013  . Elevated cholesterol   . Hormone replacement therapy (HRT) 08/10/2014  . Hot flashes, menopausal 08/10/2014  . HSV-2 (herpes simplex virus 2) infection   . Hypertension   . Low serum potassium level   . Perimenopause 04/28/2014  . Post-menopausal 08/10/2014   Past Surgical History:  Procedure Laterality Date  . CERVICAL CONE BIOPSY     Allergies  Allergen Reactions  . Sulfa Antibiotics Swelling    Causes inside of mouth to swell and be sore.   No current facility-administered medications on file prior to encounter.    Current Outpatient Medications on File Prior to Encounter  Medication Sig Dispense Refill  . Multiple Vitamins-Minerals (HAIR SKIN NAILS PO) Take by mouth 3 (three) times daily.     Social History   Socioeconomic History  . Marital status: Married    Spouse name: Not on file  . Number of children: Not on file  . Years of education: Not on file  . Highest education level: Not on file  Occupational History  . Not on file  Social Needs  . Financial resource strain: Not on file  . Food insecurity    Worry: Not on file   Inability: Not on file  . Transportation needs    Medical: Not on file    Non-medical: Not on file  Tobacco Use  . Smoking status: Never Smoker  . Smokeless tobacco: Never Used  Substance and Sexual Activity  . Alcohol use: No    Alcohol/week: 0.0 standard drinks  . Drug use: No  . Sexual activity: Yes    Birth control/protection: Post-menopausal  Lifestyle  . Physical activity    Days per week: Not on file    Minutes per session: Not on file  . Stress: Not on file  Relationships  . Social Herbalist on phone: Not on file    Gets together: Not on file    Attends religious service: Not on file    Active member of club or organization: Not on file    Attends meetings of clubs or organizations: Not on file    Relationship status: Not on file  . Intimate partner violence    Fear of current or ex partner: Not on file    Emotionally abused: Not on file    Physically abused: Not on file    Forced sexual activity: Not on file  Other Topics Concern  . Not on file  Social History Narrative  . Not on file   Family History  Problem Relation Age of Onset  . Cancer Mother  uterine, and spread  . Hypertension Father   . Hyperlipidemia Father   . Heart attack Father   . Heart disease Father   . Other Father        had 3 stents placed.  . Hypertension Sister   . Diabetes Sister   . Mental illness Sister   . Cancer Maternal Uncle   . Cancer Paternal Uncle   . Cancer Paternal Grandmother   . Cancer Paternal Grandfather     OBJECTIVE:  Vitals:   01/05/19 1324  BP: (!) 146/90  Pulse: 73  Resp: 18  Temp: 98.1 F (36.7 C)  SpO2: 96%    General appearance: ALERT; in no acute distress.  Head: NCAT Lungs: Normal respiratory effort CV: radial pulses 2+ bilaterally. Cap refill < 2 seconds Musculoskeletal: LT wrist Inspection: significant swelling diffuse about lateral wrist Palpation: TTP over distal radius ROM: LROM about the wrist Strength: 4+/5 grip  strength Skin: warm and dry Neurologic: Ambulates without difficulty; Sensation intact about the upper extremities Psychological: alert and cooperative; normal mood and affect  DIAGNOSTIC STUDIES:  Dg Wrist Complete Left  Result Date: 01/05/2019 CLINICAL DATA:  Patient states that she became startled by a dog and fell backwards using left wrist/hand to break her fall. Swelling and pain along carpals bilaterally. EXAM: LEFT WRIST - COMPLETE 3+ VIEW COMPARISON:  None. FINDINGS: There is an acute fracture of the distal aspect of the radius, extending to the articular surface posteriorly. No significant displacement. Distal ulna is intact. Carpals and proximal metacarpals are unremarkable. There is moderate soft tissue swelling. IMPRESSION: Fracture of the distal radius. Electronically Signed   By: Norva PavlovElizabeth  Brown M.D.   On: 01/05/2019 13:51    X-rays positive for distal radius fracture and soft tissue swelling.    I have reviewed the x-rays myself and the radiologist interpretation. I am in agreement with the radiologist interpretation.      ASSESSMENT & PLAN:  1. Closed Colles' fracture of left radius, initial encounter   2. Work related injury     Meds ordered this encounter  Medications  . naproxen (NAPROSYN) 375 MG tablet    Sig: Take 1 tablet (375 mg total) by mouth 2 (two) times daily.    Dispense:  20 tablet    Refill:  0    Order Specific Question:   Supervising Provider    Answer:   Eustace MooreNELSON, YVONNE SUE [[id number]]  . HYDROcodone-acetaminophen (NORCO/VICODIN) 5-325 MG tablet    Sig: Take 1 tablet by mouth at bedtime as needed for severe pain.    Dispense:  10 tablet    Refill:  0    Order Specific Question:   Supervising Provider    Answer:   Eustace MooreELSON, YVONNE SUE [[id number]]   X-rays showed distal radius fracture Wrist splint placed Continue conservative management of rest, ice, and elevation Take naproxen as needed for pain relief (may cause abdominal discomfort, ulcers, and  GI bleeds avoid taking with other NSAIDs) Take norco as needed for severe break-through pain.  DO NOT TAKE prior to driving or operating heavy machinery Follow up with orthopedist for further evaluation and managment Return or go to the ER if you have any new or worsening symptoms (fever, chills, increased swelling, redness, numbness/ tingling, chest pain, abdominal pain, symptoms do not improve with medications, etc...)   [place] Controlled Substances Registry consulted for this patient. I feel the risk/benefit ratio today is favorable for proceeding with this prescription for a controlled substance. Medication sedation precautions  given.  Reviewed expectations re: course of current medical issues. Questions answered. Outlined signs and symptoms indicating need for more acute intervention. Patient verbalized understanding. After Visit Summary given.    Rennis HardingWurst, Siri Buege, PA-C 01/05/19 1508

## 2019-01-08 ENCOUNTER — Encounter: Payer: Self-pay | Admitting: Orthopaedic Surgery

## 2019-01-08 ENCOUNTER — Other Ambulatory Visit: Payer: Self-pay

## 2019-01-08 ENCOUNTER — Ambulatory Visit (INDEPENDENT_AMBULATORY_CARE_PROVIDER_SITE_OTHER): Admitting: Orthopaedic Surgery

## 2019-01-08 VITALS — BP 145/86 | HR 72 | Temp 96.0°F | Ht 64.0 in | Wt 141.2 lb

## 2019-01-08 DIAGNOSIS — S52522A Torus fracture of lower end of left radius, initial encounter for closed fracture: Secondary | ICD-10-CM

## 2019-01-08 MED ORDER — HYDROCODONE-ACETAMINOPHEN 5-325 MG PO TABS: 1.0000 | ORAL_TABLET | ORAL | 0 refills | Status: AC | PRN

## 2019-01-08 NOTE — Progress Notes (Signed)
Subjective:    Patient ID: Beth Vang, female    DOB: 12/01/1960, 58 y.o.   MRN: [id number]  HPI She works for [employer]. 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  A dog quickly came toward her and as she backed away she fell down.  She hurt her left wrist. She was seen in the ER.  X-rays showed: FINDINGS: There is an acute fracture of the distal aspect of the radius, extending to the articular surface posteriorly. No significant displacement. Distal ulna is intact. Carpals and proximal metacarpals are unremarkable. There is moderate soft tissue swelling.  IMPRESSION: Fracture of the distal radius.  She has pain medicine and pain is controlled. She has no other injury.  I have reviewed the notes and the x-rays and report.  She has a cock-up splint on.   Review of Systems  Constitutional: Positive for activity change.  Musculoskeletal: Positive for arthralgias and joint swelling.  All other systems reviewed and are negative.  For Review of Systems, all other systems reviewed and are negative.  The following is a summary of the past history medically, past history surgically, known current medicines, social history and family history.  This information is gathered electronically by the computer from prior information and documentation.  I review this each visit and have found including this information at this point in the chart is beneficial and informative.   Past Medical History:  Diagnosis Date  . Abnormal Pap smear of cervix   . Contraceptive management 06/25/2013  . Elevated cholesterol   . Hormone replacement therapy (HRT) 08/10/2014  . Hot flashes, menopausal 08/10/2014  . HSV-2 (herpes simplex virus 2) infection   . Hypertension   . Low serum potassium level   . Perimenopause 04/28/2014  . Post-menopausal 08/10/2014    Past Surgical History:  Procedure Laterality Date  . CERVICAL CONE BIOPSY      Current Outpatient Medications on File Prior  to Visit  Medication Sig Dispense Refill  . Multiple Vitamins-Minerals (HAIR SKIN NAILS PO) Take by mouth 3 (three) times daily.    . naproxen (NAPROSYN) 375 MG tablet Take 1 tablet (375 mg total) by mouth 2 (two) times daily. 20 tablet 0   No current facility-administered medications on file prior to visit.     Social History   Socioeconomic History  . Marital status: Married    Spouse name: Not on file  . Number of children: Not on file  . Years of education: Not on file  . Highest education level: Not on file  Occupational History  . Not on file  Social Needs  . Financial resource strain: Not on file  . Food insecurity    Worry: Not on file    Inability: Not on file  . Transportation needs    Medical: Not on file    Non-medical: Not on file  Tobacco Use  . Smoking status: Never Smoker  . Smokeless tobacco: Never Used  Substance and Sexual Activity  . Alcohol use: No    Alcohol/week: 0.0 standard drinks  . Drug use: No  . Sexual activity: Yes    Birth control/protection: Post-menopausal  Lifestyle  . Physical activity    Days per week: Not on file    Minutes per session: Not on file  . Stress: Not on file  Relationships  . Social Musicianconnections    Talks on phone: Not on file    Gets together: Not on file    Attends religious  service: Not on file    Active member of club or organization: Not on file    Attends meetings of clubs or organizations: Not on file    Relationship status: Not on file  . Intimate partner violence    Fear of current or ex partner: Not on file    Emotionally abused: Not on file    Physically abused: Not on file    Forced sexual activity: Not on file  Other Topics Concern  . Not on file  Social History Narrative  . Not on file    Family History  Problem Relation Age of Onset  . Cancer Mother        uterine, and spread  . Hypertension Father   . Hyperlipidemia Father   . Heart attack Father   . Heart disease Father   . Other Father         had 3 stents placed.  . Hypertension Sister   . Diabetes Sister   . Mental illness Sister   . Cancer Maternal Uncle   . Cancer Paternal Uncle   . Cancer Paternal Grandmother   . Cancer Paternal Grandfather     BP (!) 145/86   Pulse 72   Temp (!) 96 F (35.6 C)   Ht 5\' 4"  (1.626 m)   Wt 141 lb 4 oz (64.1 kg)   LMP 04/07/2014 (Approximate)   BMI 24.25 kg/m   Body mass index is 24.25 kg/m.     Objective:   Physical Exam Vitals signs reviewed.  Constitutional:      Appearance: She is well-developed.  HENT:     Head: Normocephalic and atraumatic.  Eyes:     Conjunctiva/sclera: Conjunctivae normal.     Pupils: Pupils are equal, round, and reactive to light.  Neck:     Musculoskeletal: Normal range of motion and neck supple.  Cardiovascular:     Rate and Rhythm: Normal rate and regular rhythm.  Pulmonary:     Effort: Pulmonary effort is normal.  Abdominal:     Palpations: Abdomen is soft.  Musculoskeletal:     Left wrist: She exhibits decreased range of motion, tenderness, bony tenderness and swelling.       Arms:  Skin:    General: Skin is warm and dry.  Neurological:     Mental Status: She is alert and oriented to person, place, and time.     Cranial Nerves: No cranial nerve deficit.     Motor: No abnormal muscle tone.     Coordination: Coordination normal.     Deep Tendon Reflexes: Reflexes are normal and symmetric. Reflexes normal.  Psychiatric:        Behavior: Behavior normal.        Thought Content: Thought content normal.        Judgment: Judgment normal.           Assessment & Plan:   Encounter Diagnosis  Name Primary?  . Traumatic closed nondisplaced metaphyseal torus fracture of distal radius, left, initial encounter Yes   She is to continue the splint.  She can do light duty if available, no lifting more than 5 pounds.  She must wear splint at work.  Return in two weeks.  X-rays on return.  I have reviewed the [facility] web site prior to prescribing narcotic medicine for this patient. Call if any problem.  Precautions discussed.      Electronically Signed Sanjuana Kava, MD 8/20/20208:39 AM I

## 2019-01-08 NOTE — Patient Instructions (Signed)
No lifting over 5 pounds, wear splint left wrist.

## 2019-01-22 ENCOUNTER — Ambulatory Visit: Payer: Worker's Compensation

## 2019-01-22 ENCOUNTER — Encounter: Payer: Self-pay | Admitting: Orthopaedic Surgery

## 2019-01-22 ENCOUNTER — Other Ambulatory Visit: Payer: Self-pay

## 2019-01-22 ENCOUNTER — Ambulatory Visit (INDEPENDENT_AMBULATORY_CARE_PROVIDER_SITE_OTHER): Admitting: Orthopaedic Surgery

## 2019-01-22 DIAGNOSIS — S52521D Torus fracture of lower end of right radius, subsequent encounter for fracture with routine healing: Secondary | ICD-10-CM

## 2019-01-22 NOTE — Progress Notes (Signed)
CC:  I am better  She has less pain to the left wrist. She has been using the cock-up splint.  She has no new trauma. She is working with restricted duties.  X-rays were done, reported separately, of the left wrist.  Encounter Diagnosis  Name Primary?  . Closed traumatic nondisplaced metaphyseal torus fracture of distal end of right radius with routine healing, subsequent encounter Yes   Continue splint.  Continue maximum weight of four to five pounds.  Return in two weeks.  X-rays on return.  Call if any problem.  Precautions discussed.   Electronically Signed Sanjuana Kava, MD 9/3/202011:29 AM

## 2019-02-05 ENCOUNTER — Ambulatory Visit (INDEPENDENT_AMBULATORY_CARE_PROVIDER_SITE_OTHER): Admitting: Orthopaedic Surgery

## 2019-02-05 ENCOUNTER — Ambulatory Visit: Payer: Worker's Compensation

## 2019-02-05 ENCOUNTER — Encounter: Payer: Self-pay | Admitting: Orthopaedic Surgery

## 2019-02-05 ENCOUNTER — Other Ambulatory Visit: Payer: Self-pay

## 2019-02-05 DIAGNOSIS — S52521D Torus fracture of lower end of right radius, subsequent encounter for fracture with routine healing: Secondary | ICD-10-CM

## 2019-02-05 NOTE — Patient Instructions (Signed)
Continue light duty, no lifting more than five pounds left hand/arm.  Wear splint on left wrist.

## 2019-02-05 NOTE — Progress Notes (Signed)
CC:  My wrist is better  She has no pain with the left wrist. She has been using the splint.  NV intact.  X-rays were done of the left wrist, reported separately  Encounter Diagnosis  Name Primary?  . Closed traumatic nondisplaced metaphyseal torus fracture of distal end of right radius with routine healing, subsequent encounter Yes   Return in three weeks.  X-rays on return.  Continue light duty, no lifting more than five pounds.  Wear splint.  Call if any problem.  Precautions discussed.   Electronically Signed Sanjuana Kava, MD 9/17/20209:55 AM

## 2019-02-26 ENCOUNTER — Other Ambulatory Visit: Payer: Self-pay

## 2019-02-26 ENCOUNTER — Ambulatory Visit: Payer: Worker's Compensation

## 2019-02-26 ENCOUNTER — Ambulatory Visit (INDEPENDENT_AMBULATORY_CARE_PROVIDER_SITE_OTHER): Admitting: Orthopaedic Surgery

## 2019-02-26 ENCOUNTER — Encounter: Payer: Self-pay | Admitting: Orthopaedic Surgery

## 2019-02-26 ENCOUNTER — Other Ambulatory Visit: Payer: Self-pay | Admitting: Orthopaedic Surgery

## 2019-02-26 DIAGNOSIS — S52521D Torus fracture of lower end of right radius, subsequent encounter for fracture with routine healing: Secondary | ICD-10-CM

## 2019-02-26 DIAGNOSIS — S52502D Unspecified fracture of the lower end of left radius, subsequent encounter for closed fracture with routine healing: Secondary | ICD-10-CM

## 2019-02-26 DIAGNOSIS — S52602D Unspecified fracture of lower end of left ulna, subsequent encounter for closed fracture with routine healing: Secondary | ICD-10-CM

## 2019-02-26 NOTE — Addendum Note (Signed)
Addended by: Derek Mound A on: 02/26/2019 11:39 AM   Modules accepted: Orders

## 2019-02-26 NOTE — Progress Notes (Signed)
CC:  My wrist is better  X-rays of the left wrist show fracture is healing well.  She has decreased volar flexion.  NV intact.  I will begin PT.  No lifting more than 25 pounds.  Forms for Worker Comp completed.  Return in three weeks.  Encounter Diagnosis  Name Primary?  . Closed fracture of left distal radius and ulna, with routine healing, subsequent encounter Yes

## 2019-02-27 ENCOUNTER — Telehealth: Payer: Self-pay | Admitting: Orthopaedic Surgery

## 2019-02-27 NOTE — Telephone Encounter (Signed)
Contacted employer, [employer], due to patient's payer: W/Comp(Dept of Labor); spoke w/pt's supervisor Rose [place], [phone]). States DOL schedules therapy through [facility], [phone]. Also called Dept of Labor directly [phone], left message for claim handler [place]. Approval needed through Dept of Labor, which states we need to request by sending notes/order via mail to: [facility] / [address] / [place] [address], ph [phone].  - done, 02/27/19 (also attempted via fax through [facility] and [facility]) Pt aware.

## 2019-03-06 NOTE — Telephone Encounter (Signed)
Called patient to relay status of approval pending Workers comp/Dept of Labor approval for physical therapy; still awaiting.

## 2019-03-16 ENCOUNTER — Telehealth: Payer: Self-pay | Admitting: Radiology

## 2019-03-16 NOTE — Telephone Encounter (Signed)
What did we receive? Has Dr Luna Glasgow reviewed?

## 2019-03-16 NOTE — Telephone Encounter (Signed)
Has WC approved the PT yet?

## 2019-03-16 NOTE — Telephone Encounter (Signed)
Have received a letter from Dept of Labor Workers comp, which includes request to access and upload information onto website?  Have placed in Dr Brooke Bonito box to review and advise.Patient aware.

## 2019-03-16 NOTE — Telephone Encounter (Signed)
I am needing clinical staff to review the information received by mail. We had been advised we needed to mail the therapy request for approval. Patient aware.

## 2019-03-17 NOTE — Telephone Encounter (Signed)
Dr Luna Glasgow reviewed the information - will discuss with patient at scheduled appointment 03/19/19. (?cancel therapy?) Called patient to notify - left message.

## 2019-03-18 NOTE — Telephone Encounter (Signed)
Patient called back, aware

## 2019-03-19 ENCOUNTER — Encounter: Payer: Self-pay | Admitting: Orthopaedic Surgery

## 2019-03-19 ENCOUNTER — Telehealth: Payer: Self-pay | Admitting: Orthopaedic Surgery

## 2019-03-19 ENCOUNTER — Ambulatory Visit (INDEPENDENT_AMBULATORY_CARE_PROVIDER_SITE_OTHER): Payer: Worker's Compensation | Admitting: Orthopaedic Surgery

## 2019-03-19 ENCOUNTER — Other Ambulatory Visit: Payer: Self-pay

## 2019-03-19 VITALS — BP 139/81 | HR 79 | Ht 64.0 in | Wt 141.0 lb

## 2019-03-19 DIAGNOSIS — S52602D Unspecified fracture of lower end of left ulna, subsequent encounter for closed fracture with routine healing: Secondary | ICD-10-CM

## 2019-03-19 DIAGNOSIS — S52502D Unspecified fracture of the lower end of left radius, subsequent encounter for closed fracture with routine healing: Secondary | ICD-10-CM

## 2019-03-19 NOTE — Progress Notes (Signed)
Patient Beth Vang, female DOB:12-Sep-1960, 58 y.o. MVH:[id number]  Chief Complaint  Patient presents with  . Wrist Injury    still painful left wrist fracture     HPI  Beth Vang is a 58 y.o. female who had fracture of the left wrist.  We have been waiting for approval for PT.  We have completed forms but she has not gone to PT.  While she was here in the office, she got a phone call saying she was finally approved for PT.  She has limited volar flexion and dorsiflexion.  She needs PT.  I will keep her at light duty, no more than 25 pounds lifting.     Body mass index is 24.2 kg/m.  ROS  Review of Systems  Constitutional: Positive for activity change.  Musculoskeletal: Positive for arthralgias and joint swelling.  All other systems reviewed and are negative.   All other systems reviewed and are negative.  The following is a summary of the past history medically, past history surgically, known current medicines, social history and family history.  This information is gathered electronically by the computer from prior information and documentation.  I review this each visit and have found including this information at this point in the chart is beneficial and informative.    Past Medical History:  Diagnosis Date  . Abnormal Pap smear of cervix   . Contraceptive management 06/25/2013  . Elevated cholesterol   . Hormone replacement therapy (HRT) 08/10/2014  . Hot flashes, menopausal 08/10/2014  . HSV-2 (herpes simplex virus 2) infection   . Hypertension   . Low serum potassium level   . Perimenopause 04/28/2014  . Post-menopausal 08/10/2014    Past Surgical History:  Procedure Laterality Date  . CERVICAL CONE BIOPSY      Family History  Problem Relation Age of Onset  . Cancer Mother        uterine, and spread  . Hypertension Father   . Hyperlipidemia Father   . Heart attack Father   . Heart disease Father   . Other Father        had 3 stents placed.  .  Hypertension Sister   . Diabetes Sister   . Mental illness Sister   . Cancer Maternal Uncle   . Cancer Paternal Uncle   . Cancer Paternal Grandmother   . Cancer Paternal Grandfather     Social History Social History   Tobacco Use  . Smoking status: Never Smoker  . Smokeless tobacco: Never Used  Substance Use Topics  . Alcohol use: No    Alcohol/week: 0.0 standard drinks  . Drug use: No    Allergies  Allergen Reactions  . Sulfa Antibiotics Swelling    Causes inside of mouth to swell and be sore.  . Naprosyn [Naproxen] Rash    Current Outpatient Medications  Medication Sig Dispense Refill  . Multiple Vitamins-Minerals (HAIR SKIN NAILS PO) Take by mouth 3 (three) times daily.     No current facility-administered medications for this visit.      Physical Exam  Blood pressure 139/81, pulse 79, height 5\' 4"  (1.626 m), weight 141 lb (64 kg), last menstrual period 04/07/2014.  Constitutional: overall normal hygiene, normal nutrition, well developed, normal grooming, normal body habitus. Assistive device:none  Musculoskeletal: gait and station Limp none, muscle tone and strength are normal, no tremors or atrophy is present.  .  Neurological: coordination overall normal.  Deep tendon reflex/nerve stretch intact.  Sensation normal.  Cranial nerves II-XII intact.  Skin:   Normal overall no scars, lesions, ulcers or rashes. No psoriasis.  Psychiatric: Alert and oriented x 3.  Recent memory intact, remote memory unclear.  Normal mood and affect. Well groomed.  Good eye contact.  Cardiovascular: overall no swelling, no varicosities, no edema bilaterally, normal temperatures of the legs and arms, no clubbing, cyanosis and good capillary refill.  Lymphatic: palpation is normal.  Dorsiflexion and volar flexion of left wrist is only 20 degrees.  NV intact.  Grip good.  All other systems reviewed and are negative   The patient has been educated about the nature of the  problem(s) and counseled on treatment options.  The patient appeared to understand what I have discussed and is in agreement with it.  Encounter Diagnosis  Name Primary?  . Closed fracture of left distal radius and ulna, with routine healing, subsequent encounter Yes    PLAN Call if any problems.  Precautions discussed.  Continue current medications.   Return to clinic 5 weeks.   Go to PT.  Electronically Signed Darreld Mclean, MD 10/29/202010:30 AM

## 2019-03-19 NOTE — Telephone Encounter (Signed)
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 Currently she is working a light duty job as per Dr Brooke Bonito work status notes through and including today's visit, 03/19/19.  Relayed that Dr Luna Glasgow is out of clinic this afternoon, and until Tuesday morning, 03/23/19, and that I will send a message to Dr Luna Glasgow for further advice.  Patient states she has already reached out to her union representative, who has advised her to not accept the position, which includes driving with her (injured) left hand, twisting, turning and rotating her left hand and wrist, as well as lifting.  Please advise regarding updating work note to include no driving mail truck, no delivering of mail, and any further advice.  (Note currently reads "Light duty only / no lifting over 25 lbs,  continue 5 1/2 hour work days, through at least date of next appointment, 04/23/19, or until further notice.")

## 2019-03-19 NOTE — Patient Instructions (Signed)
Continue light duty, no more than 25 pounds

## 2019-03-19 NOTE — Telephone Encounter (Signed)
Dr Luna Glasgow (as per his review of the letter 03/17/19) relays he will discuss with patient at her scheduled appointment 03/19/19, and will further advise.

## 2019-03-24 ENCOUNTER — Encounter: Payer: Self-pay | Admitting: Orthopaedic Surgery

## 2019-03-24 ENCOUNTER — Other Ambulatory Visit: Payer: Self-pay

## 2019-03-24 ENCOUNTER — Ambulatory Visit (INDEPENDENT_AMBULATORY_CARE_PROVIDER_SITE_OTHER): Payer: Worker's Compensation | Admitting: Orthopaedic Surgery

## 2019-03-24 VITALS — BP 145/72 | HR 80 | Ht 64.0 in | Wt 141.0 lb

## 2019-03-24 DIAGNOSIS — S52602D Unspecified fracture of lower end of left ulna, subsequent encounter for closed fracture with routine healing: Secondary | ICD-10-CM

## 2019-03-24 DIAGNOSIS — S52502D Unspecified fracture of the lower end of left radius, subsequent encounter for closed fracture with routine healing: Secondary | ICD-10-CM

## 2019-03-24 NOTE — Progress Notes (Signed)
Patient Beth Vang, female DOB:[birth year], 58 y.o. UJW:[id number]  Chief Complaint  Patient presents with  . Form Completion    post office wants forms completed again     HPI  Beth Vang is a 58 y.o. female who is recovering from a fracture of the left distal radius.  She works for the [employer].  She just got approved after a delay to have PT.    I gave a note for no lifting greater than 25 pounds.  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  She was used to this however, using the left hand like this with decreased motion is not in her best interests and not appropriate in this case.  She can drive a car sitting in the normal driver side but using the left hand and reaching like she will need to do is not recommended.  She needs to get better motion in the wrist first.  Therefore, I have given a new note today to avoid driving from passenger side of car and reaching with left hand to steer.   She is to keep her regular appointment.    Go to PT.   Body mass index is 24.2 kg/m.  ROS  Review of Systems  Constitutional: Positive for activity change.  Musculoskeletal: Positive for arthralgias and joint swelling.  All other systems reviewed and are negative.   All other systems reviewed and are negative.  The following is a summary of the past history medically, past history surgically, known current medicines, social history and family history.  This information is gathered electronically by the computer from prior information and documentation.  I review this each visit and have found including this information at this point in the chart is beneficial and informative.    Past Medical History:  Diagnosis Date  . Abnormal Pap smear of cervix   . Contraceptive management 06/25/2013  . Elevated cholesterol   .  Hormone replacement therapy (HRT) 08/10/2014  . Hot flashes, menopausal 08/10/2014  . HSV-2 (herpes simplex virus 2) infection   . Hypertension   . Low serum potassium level   . Perimenopause 04/28/2014  . Post-menopausal 08/10/2014    Past Surgical History:  Procedure Laterality Date  . CERVICAL CONE BIOPSY      Family History  Problem Relation Age of Onset  . Cancer Mother        uterine, and spread  . Hypertension Father   . Hyperlipidemia Father   . Heart attack Father   . Heart disease Father   . Other Father        had 3 stents placed.  . Hypertension Sister   . Diabetes Sister   . Mental illness Sister   . Cancer Maternal Uncle   . Cancer Paternal Uncle   . Cancer Paternal Grandmother   . Cancer Paternal Grandfather     Social History Social History   Tobacco Use  . Smoking status: Never Smoker  . Smokeless tobacco: Never Used  Substance Use Topics  . Alcohol use: No    Alcohol/week: 0.0 standard drinks  . Drug use: No    Allergies  Allergen Reactions  . Sulfa Antibiotics Swelling    Causes inside of mouth to swell and be sore.  . Naprosyn [Naproxen] Rash  Current Outpatient Medications  Medication Sig Dispense Refill  . Multiple Vitamins-Minerals (HAIR SKIN NAILS PO) Take by mouth 3 (three) times daily.     No current facility-administered medications for this visit.      Physical Exam  Blood pressure (!) 145/72, pulse 80, height 5\' 4"  (1.626 m), weight 141 lb (64 kg), last menstrual period 04/07/2014.  Constitutional: overall normal hygiene, normal nutrition, well developed, normal grooming, normal body habitus. Assistive device:none  Musculoskeletal: gait and station Limp none, muscle tone and strength are normal, no tremors or atrophy is present.  .  Neurological: coordination overall normal.  Deep tendon reflex/nerve stretch intact.  Sensation normal.  Cranial nerves II-XII intact.   Skin:   Normal overall no scars, lesions, ulcers or  rashes. No psoriasis.  Psychiatric: Alert and oriented x 3.  Recent memory intact, remote memory unclear.  Normal mood and affect. Well groomed.  Good eye contact.  Cardiovascular: overall no swelling, no varicosities, no edema bilaterally, normal temperatures of the legs and arms, no clubbing, cyanosis and good capillary refill.  Lymphatic: palpation is normal.  Left wrist still has limited dorsiflexion and volar flexion.  NV intact.  All other systems reviewed and are negative   The patient has been educated about the nature of the problem(s) and counseled on treatment options.  The patient appeared to understand what I have discussed and is in agreement with it.  Encounter Diagnosis  Name Primary?  . Closed fracture of left distal radius and ulna, with routine healing, subsequent encounter Yes    PLAN Call if any problems.  Precautions discussed.  Continue current medications.   Return to clinic keep regular appointment   Electronically Signed 04/09/2014, MD 11/3/20203:50 PM

## 2019-03-24 NOTE — Telephone Encounter (Signed)
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 Due to patient's concerns as noted, she will come back in for appointment today to address concerns and further questions. Also relays that she has been approved for physical therapy at [facility], [place] - appointment scheduled this Thursday, 03/25/19, 10:40a.m.

## 2019-03-24 NOTE — Telephone Encounter (Signed)
I have given note.  It is self explanatory.  She can accept to drive or not.  It is up to her.

## 2019-04-23 ENCOUNTER — Other Ambulatory Visit: Payer: Self-pay

## 2019-04-23 ENCOUNTER — Ambulatory Visit (INDEPENDENT_AMBULATORY_CARE_PROVIDER_SITE_OTHER): Payer: Self-pay | Admitting: Orthopaedic Surgery

## 2019-04-23 ENCOUNTER — Encounter: Payer: Self-pay | Admitting: Orthopaedic Surgery

## 2019-04-23 VITALS — BP 164/86 | HR 74 | Ht 64.0 in | Wt 140.0 lb

## 2019-04-23 DIAGNOSIS — S52602D Unspecified fracture of lower end of left ulna, subsequent encounter for closed fracture with routine healing: Secondary | ICD-10-CM

## 2019-04-23 DIAGNOSIS — S52502D Unspecified fracture of the lower end of left radius, subsequent encounter for closed fracture with routine healing: Secondary | ICD-10-CM

## 2019-04-23 NOTE — Progress Notes (Signed)
CC:  I am better  She has been going to PT and I have reviewed the notes.  She is improved.  I feel she can return to work full duty as of this Saturday, December 5.  I will have her complete her remaining four visits to PT.  Return here in one month.  Encounter Diagnosis  Name Primary?  . Closed fracture of left distal radius and ulna, with routine healing, subsequent encounter Yes   Call if any problem.  Precautions discussed.   Electronically Signed Sanjuana Kava, MD 12/3/202010:33 AM

## 2019-04-23 NOTE — Patient Instructions (Addendum)
May return to work full duty as of Saturday but complete her PT appointments.

## 2019-05-21 ENCOUNTER — Encounter: Payer: Self-pay | Admitting: Orthopaedic Surgery

## 2019-05-21 ENCOUNTER — Ambulatory Visit: Payer: Worker's Compensation | Admitting: Orthopaedic Surgery

## 2020-02-17 ENCOUNTER — Ambulatory Visit: Admission: EM | Admit: 2020-02-17 | Discharge: 2020-02-17 | Discharge status: Absent without leave | Payer: 59

## 2020-02-17 ENCOUNTER — Other Ambulatory Visit: Payer: Self-pay

## 2020-02-17 DIAGNOSIS — Z20822 Contact with and (suspected) exposure to covid-19: Secondary | ICD-10-CM

## 2020-02-18 LAB — SARS-COV-2, NAA 2 DAY TAT

## 2020-02-18 LAB — NOVEL CORONAVIRUS, NAA: SARS-CoV-2, NAA: NOT DETECTED

## 2021-04-17 ENCOUNTER — Ambulatory Visit
Admission: EM | Admit: 2021-04-17 | Discharge: 2021-04-17 | Disposition: A | Discharge status: Home / Self Care | Payer: BC Managed Care – PPO | Attending: Family Medicine | Admitting: Family Medicine

## 2021-04-17 ENCOUNTER — Other Ambulatory Visit: Payer: Self-pay

## 2021-04-17 DIAGNOSIS — J069 Acute upper respiratory infection, unspecified: Secondary | ICD-10-CM | POA: Diagnosis not present

## 2021-04-17 DIAGNOSIS — R52 Pain, unspecified: Secondary | ICD-10-CM

## 2021-04-17 MED ORDER — OSELTAMIVIR PHOSPHATE 75 MG PO CAPS: 75.0000 mg | ORAL_CAPSULE | Freq: Two times a day (BID) | ORAL | 0 refills | Status: DC

## 2021-04-17 MED ORDER — PROMETHAZINE-DM 6.25-15 MG/5ML PO SYRP: 5.0000 mL | ORAL_SOLUTION | Freq: Four times a day (QID) | ORAL | 0 refills | Status: DC | PRN

## 2021-04-17 NOTE — ED Triage Notes (Signed)
Pt presents with c/o fever , cough  and nasal congestion that began on Wednesday , also reports neck stiffness

## 2021-04-18 LAB — COVID-19, FLU A+B NAA
Influenza A, NAA: NOT DETECTED
Influenza B, NAA: NOT DETECTED
SARS-CoV-2, NAA: NOT DETECTED

## 2021-04-20 NOTE — ED Provider Notes (Signed)
[facility]    CSN: [id number] Arrival date & time: 04/17/21  1014      History   Chief Complaint Chief Complaint  Patient presents with   Fever   Nasal Congestion    HPI Beth Vang is a 60 y.o. female.   Presenting today with 5 day history of fever, cough, congestion, fatigue, generalized body aches. Denies CP, SOB, abdominal pain, N/V/D. So far taking OTC cold and congestion medications with mild temporary relief. Multiple sick contacts recently with flu. No known hx of pulmonary dz.    Past Medical History:  Diagnosis Date   Abnormal Pap smear of cervix    Contraceptive management 06/25/2013   Elevated cholesterol    Hormone replacement therapy (HRT) 08/10/2014   Hot flashes, menopausal 08/10/2014   HSV-2 (herpes simplex virus 2) infection    Hypertension    Low serum potassium level    Perimenopause 04/28/2014   Post-menopausal 08/10/2014    Patient Active Problem List   Diagnosis Date Noted   Palpitations 05/04/2015   Post-menopausal 08/10/2014   Hot flashes, menopausal 08/10/2014   Hormone replacement therapy (HRT) 08/10/2014   Perimenopause 04/28/2014   Elevated cholesterol 06/25/2013   Hypertension 06/25/2013   Contraceptive management 06/25/2013    Past Surgical History:  Procedure Laterality Date   CERVICAL CONE BIOPSY      OB History     Gravida  2   Para  2   Term      Preterm      AB      Living  2      SAB      IAB      Ectopic      Multiple      Live Births               Home Medications    Prior to Admission medications   Medication Sig Start Date End Date Taking? Authorizing Provider  oseltamivir (TAMIFLU) 75 MG capsule Take 1 capsule (75 mg total) by mouth every 12 (twelve) hours. 04/17/21  Yes Particia Nearing, PA-C  promethazine-dextromethorphan (PROMETHAZINE-DM) 6.25-15 MG/5ML syrup Take 5 mLs by mouth 4 (four) times daily as needed. 04/17/21  Yes Particia Nearing, PA-C  Multiple  Vitamins-Minerals (HAIR SKIN NAILS PO) Take by mouth 3 (three) times daily.    [provider]    Family History Family History  Problem Relation Age of Onset   Cancer Mother        uterine, and spread   Hypertension Father    Hyperlipidemia Father    Heart attack Father    Heart disease Father    Other Father        had 3 stents placed.   Hypertension Sister    Diabetes Sister    Mental illness Sister    Cancer Maternal Uncle    Cancer Paternal Uncle    Cancer Paternal Grandmother    Cancer Paternal Grandfather     Social History Social History   Tobacco Use   Smoking status: Never   Smokeless tobacco: Never  Substance Use Topics   Alcohol use: No    Alcohol/week: 0.0 standard drinks   Drug use: No     Allergies   Sulfa antibiotics and Naprosyn [naproxen]   Review of Systems Review of Systems PER HPI  Physical Exam Triage Vital Signs ED Triage Vitals  Enc Vitals Group     BP 04/17/21 1424 124/88     Pulse  Rate 04/17/21 1424 (!) 104     Resp 04/17/21 1424 20     Temp 04/17/21 1424 98 F (36.7 C)     Temp src --      SpO2 04/17/21 1424 95 %     Weight --      Height --      Head Circumference --      Peak Flow --      Pain Score 04/17/21 1423 5     Pain Loc --      Pain Edu? --      Excl. in GC? --    No data found.  Updated Vital Signs BP 124/88   Pulse (!) 104   Temp 98 F (36.7 C)   Resp 20   LMP 04/07/2014 (Approximate)   SpO2 95%   Visual Acuity Right Eye Distance:   Left Eye Distance:   Bilateral Distance:    Right Eye Near:   Left Eye Near:    Bilateral Near:     Physical Exam Vitals and nursing note reviewed.  Constitutional:      Appearance: Normal appearance. She is not ill-appearing.  HENT:     Head: Atraumatic.     Right Ear: Tympanic membrane and external ear normal.     Left Ear: Tympanic membrane and external ear normal.     Nose: Congestion present.     Mouth/Throat:     Mouth: Mucous membranes are  moist.     Pharynx: Posterior oropharyngeal erythema present.  Eyes:     Extraocular Movements: Extraocular movements intact.     Conjunctiva/sclera: Conjunctivae normal.  Cardiovascular:     Rate and Rhythm: Normal rate and regular rhythm.     Heart sounds: Normal heart sounds.  Pulmonary:     Effort: Pulmonary effort is normal.     Breath sounds: Normal breath sounds. No wheezing.  Musculoskeletal:        General: Normal range of motion.     Cervical back: Normal range of motion and neck supple.  Skin:    General: Skin is warm and dry.  Neurological:     Mental Status: She is alert and oriented to person, place, and time.  Psychiatric:        Mood and Affect: Mood normal.        Thought Content: Thought content normal.        Judgment: Judgment normal.     UC Treatments / Results  Labs (all labs ordered are listed, but only abnormal results are displayed) Labs Reviewed  COVID-19, FLU A+B NAA   Narrative:    Performed at:  [address] [place], [place]  [id number] Lab Director: Jolene Schimke MD, Phone:  [phone]    EKG   Radiology No results found.  Procedures Procedures (including critical care time)  Medications Ordered in UC Medications - No data to display  Initial Impression / Assessment and Plan / UC Course  I have reviewed the triage vital signs and the nursing notes.  Pertinent labs & imaging results that were available during my care of the patient were reviewed by me and considered in my medical decision making (see chart for details).     Mildly tachycardic in triage, otherwise VS stable and WNL. Suspect viral URI, likely influenza so will proactively start tamiflu, phenergan dm for symptomatic benefit in addition to OTC remedies and supportive care. COVID and flu test pending. Return for acutely worsening sxs.  Final Clinical Impressions(s) / UC Diagnoses   Final diagnoses:  Viral URI with cough  Generalized body  aches   Discharge Instructions   None    ED Prescriptions     Medication Sig Dispense Auth. Provider   oseltamivir (TAMIFLU) 75 MG capsule Take 1 capsule (75 mg total) by mouth every 12 (twelve) hours. 10 capsule Particia Nearing, [place]   promethazine-dextromethorphan (PROMETHAZINE-DM) 6.25-15 MG/5ML syrup Take 5 mLs by mouth 4 (four) times daily as needed. 100 mL Particia Nearing, [place]      PDMP not reviewed this encounter.   Particia Nearing, [place] 04/20/21 2300

## 2022-01-18 DIAGNOSIS — Z23 Encounter for immunization: Secondary | ICD-10-CM | POA: Diagnosis not present

## 2022-01-18 DIAGNOSIS — E663 Overweight: Secondary | ICD-10-CM | POA: Diagnosis not present

## 2022-01-18 DIAGNOSIS — Z1331 Encounter for screening for depression: Secondary | ICD-10-CM | POA: Diagnosis not present

## 2022-01-18 DIAGNOSIS — Z124 Encounter for screening for malignant neoplasm of cervix: Secondary | ICD-10-CM | POA: Diagnosis not present

## 2022-01-18 DIAGNOSIS — Z0001 Encounter for general adult medical examination with abnormal findings: Secondary | ICD-10-CM | POA: Diagnosis not present

## 2022-01-18 DIAGNOSIS — Z6827 Body mass index (BMI) 27.0-27.9, adult: Secondary | ICD-10-CM | POA: Diagnosis not present

## 2022-01-19 DIAGNOSIS — Z0001 Encounter for general adult medical examination with abnormal findings: Secondary | ICD-10-CM | POA: Diagnosis not present

## 2022-02-07 ENCOUNTER — Other Ambulatory Visit (HOSPITAL_COMMUNITY): Payer: Self-pay | Admitting: Family Medicine

## 2022-02-07 DIAGNOSIS — Z1231 Encounter for screening mammogram for malignant neoplasm of breast: Secondary | ICD-10-CM

## 2022-02-08 ENCOUNTER — Encounter (INDEPENDENT_AMBULATORY_CARE_PROVIDER_SITE_OTHER): Payer: Self-pay | Admitting: *Deleted

## 2022-02-12 ENCOUNTER — Ambulatory Visit (HOSPITAL_COMMUNITY)
Admission: RE | Admit: 2022-02-12 | Discharge: 2022-02-12 | Disposition: A | Discharge status: Home / Self Care | Payer: BC Managed Care – PPO | Source: Ambulatory Visit | Attending: Family Medicine | Admitting: Family Medicine

## 2022-02-12 DIAGNOSIS — Z1231 Encounter for screening mammogram for malignant neoplasm of breast: Secondary | ICD-10-CM | POA: Insufficient documentation

## 2022-02-14 ENCOUNTER — Other Ambulatory Visit (HOSPITAL_COMMUNITY): Payer: Self-pay | Admitting: Family Medicine

## 2022-02-14 DIAGNOSIS — R928 Other abnormal and inconclusive findings on diagnostic imaging of breast: Secondary | ICD-10-CM

## 2022-02-20 ENCOUNTER — Ambulatory Visit (HOSPITAL_COMMUNITY)
Admission: RE | Admit: 2022-02-20 | Discharge: 2022-02-20 | Disposition: A | Discharge status: Home / Self Care | Payer: BC Managed Care – PPO | Source: Ambulatory Visit | Attending: Family Medicine | Admitting: Family Medicine

## 2022-02-20 DIAGNOSIS — R928 Other abnormal and inconclusive findings on diagnostic imaging of breast: Secondary | ICD-10-CM

## 2022-02-20 DIAGNOSIS — R922 Inconclusive mammogram: Secondary | ICD-10-CM | POA: Diagnosis not present

## 2022-07-20 ENCOUNTER — Encounter (INDEPENDENT_AMBULATORY_CARE_PROVIDER_SITE_OTHER): Payer: Self-pay | Admitting: *Deleted

## 2023-01-24 DIAGNOSIS — E663 Overweight: Secondary | ICD-10-CM | POA: Diagnosis not present

## 2023-01-24 DIAGNOSIS — E782 Mixed hyperlipidemia: Secondary | ICD-10-CM | POA: Diagnosis not present

## 2023-01-24 DIAGNOSIS — Z1331 Encounter for screening for depression: Secondary | ICD-10-CM | POA: Diagnosis not present

## 2023-01-24 DIAGNOSIS — E7849 Other hyperlipidemia: Secondary | ICD-10-CM | POA: Diagnosis not present

## 2023-01-24 DIAGNOSIS — Z8249 Family history of ischemic heart disease and other diseases of the circulatory system: Secondary | ICD-10-CM | POA: Diagnosis not present

## 2023-01-24 DIAGNOSIS — Z6828 Body mass index (BMI) 28.0-28.9, adult: Secondary | ICD-10-CM | POA: Diagnosis not present

## 2023-01-24 DIAGNOSIS — Z0001 Encounter for general adult medical examination with abnormal findings: Secondary | ICD-10-CM | POA: Diagnosis not present

## 2023-02-04 ENCOUNTER — Encounter: Payer: Self-pay | Admitting: *Deleted

## 2023-03-18 ENCOUNTER — Other Ambulatory Visit: Payer: Self-pay | Admitting: *Deleted

## 2023-03-18 DIAGNOSIS — Z1211 Encounter for screening for malignant neoplasm of colon: Secondary | ICD-10-CM

## 2023-03-19 ENCOUNTER — Encounter: Payer: Self-pay | Admitting: General Surgery

## 2023-03-19 ENCOUNTER — Ambulatory Visit (INDEPENDENT_AMBULATORY_CARE_PROVIDER_SITE_OTHER): Payer: BC Managed Care – PPO | Admitting: General Surgery

## 2023-03-19 VITALS — BP 152/81 | HR 75 | Temp 98.2°F | Resp 14 | Ht 64.0 in | Wt 154.0 lb

## 2023-03-19 DIAGNOSIS — Z1211 Encounter for screening for malignant neoplasm of colon: Secondary | ICD-10-CM

## 2023-03-19 MED ORDER — SUTAB 1479-225-188 MG PO TABS: 24.0000 | ORAL_TABLET | Freq: Once | ORAL | 0 refills | Status: AC

## 2023-03-20 ENCOUNTER — Other Ambulatory Visit (HOSPITAL_COMMUNITY): Payer: Self-pay | Admitting: Family Medicine

## 2023-03-20 DIAGNOSIS — Z1231 Encounter for screening mammogram for malignant neoplasm of breast: Secondary | ICD-10-CM

## 2023-03-20 NOTE — Progress Notes (Signed)
Beth Vang; [id number]; [birth year]   HPI Patient is a 62 year old black female who was referred to my care by Terie Purser for a screening colonoscopy.  Patient has never had a colonoscopy.  She denies any family history of colon cancer.  Patient denies any blood per rectum, abnormal diarrhea or constipation, or abnormal weight change. Past Medical History:  Diagnosis Date   Abnormal Pap smear of cervix    Contraceptive management 06/25/2013   Elevated cholesterol    Hormone replacement therapy (HRT) 08/10/2014   Hot flashes, menopausal 08/10/2014   HSV-2 (herpes simplex virus 2) infection    Hypertension    Low serum potassium level    Perimenopause 04/28/2014   Post-menopausal 08/10/2014    Past Surgical History:  Procedure Laterality Date   CERVICAL CONE BIOPSY      Family History  Problem Relation Age of Onset   Cancer Mother        uterine, and spread   Hypertension Father    Hyperlipidemia Father    Heart attack Father    Heart disease Father    Other Father        had 3 stents placed.   Hypertension Sister    Diabetes Sister    Mental illness Sister    Cancer Maternal Uncle    Cancer Paternal Uncle    Cancer Paternal Grandmother    Cancer Paternal Grandfather     Current Outpatient Medications on File Prior to Visit  Medication Sig Dispense Refill   amLODipine (NORVASC) 2.5 MG tablet Take 2.5 mg by mouth daily.     atorvastatin (LIPITOR) 40 MG tablet Take 40 mg by mouth daily.     Cholecalciferol (VITAMIN D-3) 25 MCG (1000 UT) CAPS Take by mouth.     Coenzyme Q10 10 MG capsule Take 10 mg by mouth daily.     No current facility-administered medications on file prior to visit.    Allergies  Allergen Reactions   Sulfa Antibiotics Swelling    Causes inside of mouth to swell and be sore.   Naprosyn [Naproxen] Rash    Social History   Substance and Sexual Activity  Alcohol Use No   Alcohol/week: 0.0 standard drinks of alcohol    Social History    Tobacco Use  Smoking Status Never  Smokeless Tobacco Never    Review of Systems  Constitutional: Negative.   HENT: Negative.    Eyes: Negative.   Respiratory: Negative.    Cardiovascular: Negative.   Gastrointestinal: Negative.   Genitourinary: Negative.   Musculoskeletal: Negative.   Skin: Negative.   Neurological: Negative.   Endo/Heme/Allergies: Negative.   Psychiatric/Behavioral: Negative.      Objective   Vitals:   03/19/23 1138  BP: (!) 152/81  Pulse: 75  Resp: 14  Temp: 98.2 F (36.8 C)  SpO2: 99%    Physical Exam Vitals reviewed.  Constitutional:      Appearance: Normal appearance. She is normal weight. She is not ill-appearing.  HENT:     Head: Normocephalic and atraumatic.  Cardiovascular:     Rate and Rhythm: Normal rate and regular rhythm.     Heart sounds: Normal heart sounds. No murmur heard.    No friction rub. No gallop.  Pulmonary:     Effort: Pulmonary effort is normal. No respiratory distress.     Breath sounds: Normal breath sounds. No stridor. No wheezing, rhonchi or rales.  Abdominal:     General: Bowel sounds are normal. There is no distension.  Palpations: Abdomen is soft. There is no mass.     Tenderness: There is no abdominal tenderness. There is no guarding or rebound.     Hernia: No hernia is present.  Skin:    General: Skin is warm and dry.  Neurological:     Mental Status: She is alert and oriented to person, place, and time.    Primary care notes reviewed Assessment  Need for screening colonoscopy Plan  Patient is scheduled for a screening colonoscopy on 04/09/2023.  The risks and benefits of the procedure including bleeding and perforation were fully explained to the patient, who gave informed consent.  Sutabs have been prescribed for bowel preparation.

## 2023-03-20 NOTE — H&P (Signed)
Beth Vang; [id number]; [birth year]   HPI Patient is a 62 year old black female who was referred to my care by Beth Vang for a screening colonoscopy.  Patient has never had a colonoscopy.  She denies any family history of colon cancer.  Patient denies any blood per rectum, abnormal diarrhea or constipation, or abnormal weight change. Past Medical History:  Diagnosis Date   Abnormal Pap smear of cervix    Contraceptive management 06/25/2013   Elevated cholesterol    Hormone replacement therapy (HRT) 08/10/2014   Hot flashes, menopausal 08/10/2014   HSV-2 (herpes simplex virus 2) infection    Hypertension    Low serum potassium level    Perimenopause 04/28/2014   Post-menopausal 08/10/2014    Past Surgical History:  Procedure Laterality Date   CERVICAL CONE BIOPSY      Family History  Problem Relation Age of Onset   Cancer Mother        uterine, and spread   Hypertension Father    Hyperlipidemia Father    Heart attack Father    Heart disease Father    Other Father        had 3 stents placed.   Hypertension Sister    Diabetes Sister    Mental illness Sister    Cancer Maternal Uncle    Cancer Paternal Uncle    Cancer Paternal Grandmother    Cancer Paternal Grandfather     Current Outpatient Medications on File Prior to Visit  Medication Sig Dispense Refill   amLODipine (NORVASC) 2.5 MG tablet Take 2.5 mg by mouth daily.     atorvastatin (LIPITOR) 40 MG tablet Take 40 mg by mouth daily.     Cholecalciferol (VITAMIN D-3) 25 MCG (1000 UT) CAPS Take by mouth.     Coenzyme Q10 10 MG capsule Take 10 mg by mouth daily.     No current facility-administered medications on file prior to visit.    Allergies  Allergen Reactions   Sulfa Antibiotics Swelling    Causes inside of mouth to swell and be sore.   Naprosyn [Naproxen] Rash    Social History   Substance and Sexual Activity  Alcohol Use No   Alcohol/week: 0.0 standard drinks of alcohol    Social History    Tobacco Use  Smoking Status Never  Smokeless Tobacco Never    Review of Systems  Constitutional: Negative.   HENT: Negative.    Eyes: Negative.   Respiratory: Negative.    Cardiovascular: Negative.   Gastrointestinal: Negative.   Genitourinary: Negative.   Musculoskeletal: Negative.   Skin: Negative.   Neurological: Negative.   Endo/Heme/Allergies: Negative.   Psychiatric/Behavioral: Negative.      Objective   Vitals:   03/19/23 1138  BP: (!) 152/81  Pulse: 75  Resp: 14  Temp: 98.2 F (36.8 C)  SpO2: 99%    Physical Exam Vitals reviewed.  Constitutional:      Appearance: Normal appearance. She is normal weight. She is not ill-appearing.  HENT:     Head: Normocephalic and atraumatic.  Cardiovascular:     Rate and Rhythm: Normal rate and regular rhythm.     Heart sounds: Normal heart sounds. No murmur heard.    No friction rub. No gallop.  Pulmonary:     Effort: Pulmonary effort is normal. No respiratory distress.     Breath sounds: Normal breath sounds. No stridor. No wheezing, rhonchi or rales.  Abdominal:     General: Bowel sounds are normal. There is no distension.  Palpations: Abdomen is soft. There is no mass.     Tenderness: There is no abdominal tenderness. There is no guarding or rebound.     Hernia: No hernia is present.  Skin:    General: Skin is warm and dry.  Neurological:     Mental Status: She is alert and oriented to person, place, and time.    Primary care notes reviewed Assessment  Need for screening colonoscopy Plan  Patient is scheduled for a screening colonoscopy on 04/09/2023.  The risks and benefits of the procedure including bleeding and perforation were fully explained to the patient, who gave informed consent.  Sutabs have been prescribed for bowel preparation.

## 2023-03-25 ENCOUNTER — Ambulatory Visit (HOSPITAL_COMMUNITY)
Admission: RE | Admit: 2023-03-25 | Discharge: 2023-03-25 | Disposition: A | Discharge status: Home / Self Care | Payer: BC Managed Care – PPO | Source: Ambulatory Visit | Attending: Family Medicine | Admitting: Family Medicine

## 2023-03-25 DIAGNOSIS — Z1231 Encounter for screening mammogram for malignant neoplasm of breast: Secondary | ICD-10-CM | POA: Diagnosis not present

## 2023-04-09 ENCOUNTER — Ambulatory Visit (HOSPITAL_COMMUNITY)
Admission: RE | Admit: 2023-04-09 | Discharge: 2023-04-09 | Disposition: A | Discharge status: Home / Self Care | Payer: BC Managed Care – PPO | Attending: General Surgery | Admitting: General Surgery

## 2023-04-09 ENCOUNTER — Encounter (HOSPITAL_COMMUNITY): Payer: Self-pay | Admitting: General Surgery

## 2023-04-09 ENCOUNTER — Ambulatory Visit (HOSPITAL_COMMUNITY): Payer: BC Managed Care – PPO | Admitting: Certified Registered Nurse Anesthetist

## 2023-04-09 ENCOUNTER — Encounter (HOSPITAL_COMMUNITY)
Admission: RE | Disposition: A | Discharge status: Home / Self Care | Payer: Self-pay | Source: Home / Self Care | Attending: General Surgery

## 2023-04-09 ENCOUNTER — Other Ambulatory Visit: Payer: Self-pay

## 2023-04-09 DIAGNOSIS — I10 Essential (primary) hypertension: Secondary | ICD-10-CM | POA: Diagnosis not present

## 2023-04-09 DIAGNOSIS — Z79899 Other long term (current) drug therapy: Secondary | ICD-10-CM | POA: Diagnosis not present

## 2023-04-09 DIAGNOSIS — E7800 Pure hypercholesterolemia, unspecified: Secondary | ICD-10-CM | POA: Diagnosis not present

## 2023-04-09 DIAGNOSIS — Z1211 Encounter for screening for malignant neoplasm of colon: Secondary | ICD-10-CM | POA: Diagnosis not present

## 2023-04-09 HISTORY — PX: COLONOSCOPY WITH PROPOFOL: SHX5780

## 2023-04-09 SURGERY — COLONOSCOPY WITH PROPOFOL
Anesthesia: General

## 2023-04-09 MED ORDER — LACTATED RINGERS IV SOLN: INTRAVENOUS | Status: DC | PRN

## 2023-04-09 MED ORDER — PROPOFOL 10 MG/ML IV BOLUS
INTRAVENOUS | Status: DC | PRN
  Administered 2023-04-09: 50 mg via INTRAVENOUS
  Administered 2023-04-09: 100 mg via INTRAVENOUS
  Administered 2023-04-09: 50 mg via INTRAVENOUS

## 2023-04-09 NOTE — Op Note (Signed)
[facility] Patient Name: Beth Vang Procedure Date: 04/09/2023 7:30 AM MRN: [id number] Date of Birth: [birth year] Attending MD: Franky Macho , MD, [id number] CSN: [id number] Age: 62 Admit Type: Outpatient Procedure:                Colonoscopy Indications:              Screening for colorectal malignant neoplasm Providers:                Franky Macho, MD, Buel Ream. Thomasena Edis RN, RN, Elinor Parkinson Referring MD:              Medicines:                Propofol per Anesthesia Complications:            No immediate complications. Estimated Blood Loss:     Estimated blood loss: none. Procedure:                Pre-Anesthesia Assessment:                           - Prior to the procedure, a History and Physical                            was performed, and patient medications and                            allergies were reviewed. The patient is competent.                            The risks and benefits of the procedure and the                            sedation options and risks were discussed with the                            patient. All questions were answered and informed                            consent was obtained. Patient identification and                            proposed procedure were verified by the physician,                            the nurse, the anesthesiologist, the anesthetist                            and the technician in the endoscopy suite. Mental                            Status Examination: alert and oriented. Airway  Examination: normal oropharyngeal airway and neck                            mobility. Respiratory Examination: clear to                            auscultation. CV Examination: RRR, no murmurs, no                            S3 or S4. Prophylactic Antibiotics: The patient                            does not require prophylactic antibiotics. Prior                             Anticoagulants: The patient has taken no                            anticoagulant or antiplatelet agents. ASA Grade                            Assessment: II - A patient with mild systemic                            disease. After reviewing the risks and benefits,                            the patient was deemed in satisfactory condition to                            undergo the procedure. The anesthesia plan was to                            use deep sedation / analgesia. Immediately prior to                            administration of medications, the patient was                            re-assessed for adequacy to receive sedatives. The                            heart rate, respiratory rate, oxygen saturations,                            blood pressure, adequacy of pulmonary ventilation,                            and response to care were monitored throughout the                            procedure. The physical status of the patient was  re-assessed after the procedure.                           After obtaining informed consent, the colonoscope                            was passed under direct vision. Throughout the                            procedure, the patient's blood pressure, pulse, and                            oxygen saturations were monitored continuously. The                            [id number]) scope was introduced through the                            anus and advanced to the the cecum, identified by                            the appendiceal orifice, ileocecal valve and                            palpation. The terminal ileum was photographed. The                            entire colon was examined. The colonoscopy was                            performed without difficulty. The quality of the                            bowel preparation was adequate. The total duration                            of the procedure was 9  minutes. Scope In: 7:39:49 AM Scope Out: 7:48:30 AM Scope Withdrawal Time: 0 hours 2 minutes 52 seconds  Total Procedure Duration: 0 hours 8 minutes 41 seconds  Findings:      The perianal and digital rectal examinations were normal.      The entire examined colon appeared normal on direct and retroflexion       views. Impression:               - The entire examined colon is normal on direct and                            retroflexion views.                           - No specimens collected. Moderate Sedation:      Moderate (conscious) sedation was administered by the nurse and       supervised by the endoscopist. The patient's oxygen saturation, heart       rate, blood pressure and response to  care were monitored. Recommendation:           - Written discharge instructions were provided to                            the patient.                           - The signs and symptoms of potential delayed                            complications were discussed with the patient.                           - Patient has a contact number available for                            emergencies.                           - Return to normal activities tomorrow.                           - Resume previous diet.                           - Continue present medications.                           - Repeat colonoscopy in 10 years for screening                            purposes. Procedure Code(s):        --- Professional ---                           [phone], Colonoscopy, flexible; diagnostic, including                            collection of specimen(s) by brushing or washing,                            when performed (separate procedure) Diagnosis Code(s):        --- Professional ---                           Z12.11, Encounter for screening for malignant                            neoplasm of colon CPT copyright 2022 [employer]. All rights reserved. The codes documented in this  report are preliminary and upon coder review may  be revised to meet current compliance requirements. Franky Macho, MD Franky Macho, MD 04/09/2023 7:53:37 AM This report has been signed electronically. Number of Addenda: 0

## 2023-04-09 NOTE — Anesthesia Postprocedure Evaluation (Signed)
Anesthesia Post Note  Patient: Beth Vang  Procedure(s) Performed: COLONOSCOPY WITH PROPOFOL  Patient location during evaluation: Phase II Anesthesia Type: General Level of consciousness: awake Pain management: pain level controlled Vital Signs Assessment: post-procedure vital signs reviewed and stable Respiratory status: spontaneous breathing and respiratory function stable Cardiovascular status: blood pressure returned to baseline and stable Postop Assessment: no headache and no apparent nausea or vomiting Anesthetic complications: no Comments: Late entry   No notable events documented.   Last Vitals:  Vitals:   04/09/23 0751 04/09/23 0756  BP: (!) 84/52 97/61  Pulse: 83   Resp: 20   Temp: 36.4 C   SpO2: 96%     Last Pain:  Vitals:   04/09/23 0754  TempSrc:   PainSc: 0-No pain                 [name]

## 2023-04-09 NOTE — Interval H&P Note (Signed)
History and Physical Interval Note:  04/09/2023 7:32 AM  Beth Vang  has presented today for surgery, with the diagnosis of SCREENING FOR COLON CANCER.  The various methods of treatment have been discussed with the patient and family. After consideration of risks, benefits and other options for treatment, the patient has consented to  Procedure(s): COLONOSCOPY WITH PROPOFOL (N/A) as a surgical intervention.  The patient's history has been reviewed, patient examined, no change in status, stable for surgery.  I have reviewed the patient's chart and labs.  Questions were answered to the patient's satisfaction.     Franky Macho

## 2023-04-09 NOTE — Anesthesia Preprocedure Evaluation (Signed)

## 2023-04-09 NOTE — Transfer of Care (Signed)
Immediate Anesthesia Transfer of Care Note  Patient: Beth Vang  Procedure(s) Performed: COLONOSCOPY WITH PROPOFOL  Patient Location: Endoscopy Unit  Anesthesia Type:General  Level of Consciousness: awake, alert , and oriented  Airway & Oxygen Therapy: Patient Spontanous Breathing  Post-op Assessment: Report given to RN and Post -op Vital signs reviewed and stable  Post vital signs: Reviewed and stable  Last Vitals:  Vitals Value Taken Time  BP 84/52 04/09/23 0751  Temp 36.4 C 04/09/23 0751  Pulse 83 04/09/23 0751  Resp 20 04/09/23 0751  SpO2 96 % 04/09/23 0751    Last Pain:  Vitals:   04/09/23 0751  TempSrc: Oral  PainSc:       Patients Stated Pain Goal: 5 (04/09/23 1610)  Complications: No notable events documented.

## 2023-04-15 ENCOUNTER — Encounter (HOSPITAL_COMMUNITY): Payer: Self-pay | Admitting: General Surgery

## 2024-01-02 DIAGNOSIS — Z6828 Body mass index (BMI) 28.0-28.9, adult: Secondary | ICD-10-CM | POA: Diagnosis not present

## 2024-01-02 DIAGNOSIS — E663 Overweight: Secondary | ICD-10-CM | POA: Diagnosis not present

## 2024-01-02 DIAGNOSIS — N898 Other specified noninflammatory disorders of vagina: Secondary | ICD-10-CM | POA: Diagnosis not present
